# Patient Record
Sex: Female | Born: 1995 | Race: Black or African American | Hispanic: No | Marital: Single | Smoking: Former smoker
Health system: Southern US, Community
[De-identification: ages and names within clinical notes are randomized; demographics above are authoritative.]

## PROBLEM LIST (undated history)

## (undated) ENCOUNTER — Inpatient Hospital Stay (HOSPITAL_COMMUNITY): Payer: Self-pay

## (undated) DIAGNOSIS — N200 Calculus of kidney: Secondary | ICD-10-CM

## (undated) DIAGNOSIS — Z789 Other specified health status: Secondary | ICD-10-CM

## (undated) DIAGNOSIS — N390 Urinary tract infection, site not specified: Secondary | ICD-10-CM

## (undated) HISTORY — PX: NO PAST SURGERIES: SHX2092

---
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
## 2018-01-25 ENCOUNTER — Emergency Department (HOSPITAL_COMMUNITY)
Admission: EM | Admit: 2018-01-25 | Discharge: 2018-01-25 | Disposition: A | Discharge status: Home / Self Care | Payer: Self-pay | Attending: Emergency Medicine | Admitting: Emergency Medicine

## 2018-01-25 ENCOUNTER — Encounter (HOSPITAL_COMMUNITY): Payer: Self-pay

## 2018-01-25 ENCOUNTER — Emergency Department (HOSPITAL_COMMUNITY): Payer: Self-pay

## 2018-01-25 DIAGNOSIS — Y999 Unspecified external cause status: Secondary | ICD-10-CM | POA: Insufficient documentation

## 2018-01-25 DIAGNOSIS — F17210 Nicotine dependence, cigarettes, uncomplicated: Secondary | ICD-10-CM | POA: Insufficient documentation

## 2018-01-25 DIAGNOSIS — Y929 Unspecified place or not applicable: Secondary | ICD-10-CM | POA: Insufficient documentation

## 2018-01-25 DIAGNOSIS — W01190A Fall on same level from slipping, tripping and stumbling with subsequent striking against furniture, initial encounter: Secondary | ICD-10-CM | POA: Insufficient documentation

## 2018-01-25 DIAGNOSIS — Y939 Activity, unspecified: Secondary | ICD-10-CM | POA: Insufficient documentation

## 2018-01-25 DIAGNOSIS — S20212A Contusion of left front wall of thorax, initial encounter: Secondary | ICD-10-CM | POA: Insufficient documentation

## 2018-01-25 MED ORDER — NAPROXEN 375 MG PO TABS: 375.0000 mg | ORAL_TABLET | Freq: Two times a day (BID) | ORAL | 0 refills | Status: DC

## 2018-01-25 MED ORDER — CYCLOBENZAPRINE HCL 10 MG PO TABS
5.0000 mg | ORAL_TABLET | Freq: Once | ORAL | Status: AC
  Administered 2018-01-25: 5 mg via ORAL
  Filled 2018-01-25: qty 1

## 2018-01-25 MED ORDER — CYCLOBENZAPRINE HCL 5 MG PO TABS: 5.0000 mg | ORAL_TABLET | Freq: Every evening | ORAL | 0 refills | Status: DC | PRN

## 2018-01-25 MED ORDER — IBUPROFEN 400 MG PO TABS
400.0000 mg | ORAL_TABLET | Freq: Once | ORAL | Status: AC
  Administered 2018-01-25: 400 mg via ORAL
  Filled 2018-01-25: qty 1

## 2018-01-25 NOTE — ED Triage Notes (Signed)
Pt reports that she fell onto her L side after tripping over something and reports rib pain now, denies SOB, denies hitting head or LOC

## 2018-01-25 NOTE — Discharge Instructions (Signed)
Follow-up with your primary care doctor.  Return here as needed °

## 2018-01-25 NOTE — ED Provider Notes (Signed)
[facility] Provider Note   CSN: [id number] Arrival date & time: 01/25/18  2025     History   Chief Complaint Chief Complaint  Patient presents with  . Rib pain    HPI Alexis Gill is a 22 y.o. female who presents to the ED with rib pain. Patient reports she slipped on a wet floor when mopping and fell on her left side and hit her ribs on the kitchen table. She c/o pain but denies shortness of breath. She denies head injury or LOC.   HPI  History reviewed. No pertinent past medical history.  There are no active problems to display for this patient.   History reviewed. No pertinent surgical history.  OB History    No data available       Home Medications    Prior to Admission medications   Medication Sig Start Date End Date Taking? Authorizing Provider  cyclobenzaprine (FLEXERIL) 5 MG tablet Take 1 tablet (5 mg total) by mouth at bedtime as needed. 01/25/18   Janne NapoleonNeese, Hope M, NP  HYDROcodone-acetaminophen (NORCO/VICODIN) 5-325 MG per tablet Take 1-2 tablets by mouth every 6 (six) hours as needed for moderate pain. 05/28/15   Niel HummerKuhner, Ross, MD  naproxen (NAPROSYN) 375 MG tablet Take 1 tablet (375 mg total) by mouth 2 (two) times daily. 01/25/18   Janne NapoleonNeese, Hope M, NP    Family History No family history on file.  Social History Social History   Tobacco Use  . Smoking status: Current Every Day Smoker  . Smokeless tobacco: Never Used  Substance Use Topics  . Alcohol use: No  . Drug use: No     Allergies   Penicillins and Amoxicillin   Review of Systems Review of Systems  Cardiovascular:       Left rib pain  Musculoskeletal: Positive for arthralgias.  All other systems reviewed and are negative.    Physical Exam Updated Vital Signs BP 111/84   Pulse 94   Temp 98 F (36.7 C) (Oral)   Resp 18   Ht 5\' 1"  (1.549 m)   Wt 47.6 kg (105 lb)   LMP 01/05/2018   SpO2 98%   BMI 19.84 kg/m   Physical Exam    Constitutional: She appears well-developed and well-nourished. No distress.  HENT:  Head: Normocephalic.  Eyes: EOM are normal.  Neck: Neck supple.  Cardiovascular: Normal rate and regular rhythm.  Pulmonary/Chest: Effort normal and breath sounds normal.  Tender with palpation to the left anterior ribs.  Abdominal: There is no tenderness.  Musculoskeletal: Normal range of motion.  Neurological: She is alert.  Skin: Skin is warm and dry.  Psychiatric: She has a normal mood and affect.  Nursing note and vitals reviewed.    ED Treatments / Results  Labs (all labs ordered are listed, but only abnormal results are displayed) Labs Reviewed - No data to display  Radiology Dg Ribs Unilateral W/chest Left  Result Date: 01/25/2018 CLINICAL DATA:  Acute LEFT chest pain following fall and injury today. Initial encounter. EXAM: LEFT RIBS AND CHEST - 3+ VIEW COMPARISON:  None. FINDINGS: Cardiomediastinal silhouette is unremarkable. There is no evidence of focal airspace disease, pulmonary edema, suspicious pulmonary nodule/mass, pleural effusion, or pneumothorax. No acute bony abnormalities are identified. No rib abnormalities are identified. IMPRESSION: Negative. Electronically Signed   By: Harmon PierJeffrey  Hu M.D.   On: 01/25/2018 21:15    Procedures Procedures (including critical care time)  Medications Ordered in ED Medications  cyclobenzaprine (FLEXERIL) tablet 5 mg (not administered)  ibuprofen (ADVIL,MOTRIN) tablet 400 mg (not administered)     Initial Impression / Assessment and Plan / ED Course  I have reviewed the triage vital signs and the nursing notes. 22 y.o. female with left rib pain s/p fall stable for d/c without fracture or pneumothorax noted on x-ray. Will treat for pain and return precautions discussed. Patient agrees with plan.  Final Clinical Impressions(s) / ED Diagnoses   Final diagnoses:  Rib contusion, left, initial encounter    ED Discharge Orders        Ordered     cyclobenzaprine (FLEXERIL) 5 MG tablet  At bedtime PRN     01/25/18 2157    naproxen (NAPROSYN) 375 MG tablet  2 times daily     01/25/18 2157       Kerrie [place], NP 01/25/18 2203    Linwood Dibbles, MD 01/25/18 2212

## 2018-01-25 NOTE — ED Notes (Signed)
ED Provider at bedside. 

## 2018-08-23 ENCOUNTER — Emergency Department (HOSPITAL_COMMUNITY)
Admission: EM | Admit: 2018-08-23 | Discharge: 2018-08-24 | Disposition: A | Discharge status: Home / Self Care | Payer: Self-pay | Attending: Emergency Medicine | Admitting: Emergency Medicine

## 2018-08-23 DIAGNOSIS — R1084 Generalized abdominal pain: Secondary | ICD-10-CM | POA: Insufficient documentation

## 2018-08-23 DIAGNOSIS — Z79899 Other long term (current) drug therapy: Secondary | ICD-10-CM | POA: Insufficient documentation

## 2018-08-23 DIAGNOSIS — R112 Nausea with vomiting, unspecified: Secondary | ICD-10-CM | POA: Insufficient documentation

## 2018-08-23 DIAGNOSIS — F17210 Nicotine dependence, cigarettes, uncomplicated: Secondary | ICD-10-CM | POA: Insufficient documentation

## 2018-08-24 ENCOUNTER — Encounter (HOSPITAL_COMMUNITY): Payer: Self-pay | Admitting: *Deleted

## 2018-08-24 ENCOUNTER — Other Ambulatory Visit: Payer: Self-pay

## 2018-08-24 LAB — COMPREHENSIVE METABOLIC PANEL
ALBUMIN: 3.7 g/dL (ref 3.5–5.0)
ALK PHOS: 34 U/L — AB (ref 38–126)
ALT: 20 U/L (ref 0–44)
AST: 24 U/L (ref 15–41)
Anion gap: 10 (ref 5–15)
BUN: 8 mg/dL (ref 6–20)
CO2: 23 mmol/L (ref 22–32)
CREATININE: 0.71 mg/dL (ref 0.44–1.00)
Calcium: 9.2 mg/dL (ref 8.9–10.3)
Chloride: 108 mmol/L (ref 98–111)
GFR calc non Af Amer: >60 mL/min (ref >60)
GLUCOSE: 96 mg/dL (ref 70–99)
Potassium: 3.5 mmol/L (ref 3.5–5.1)
SODIUM: 141 mmol/L (ref 135–145)
Total Bilirubin: 0.5 mg/dL (ref 0.3–1.2)
Total Protein: 6.4 g/dL — ABNORMAL LOW (ref 6.5–8.1)

## 2018-08-24 LAB — CBC
HCT: 41.1 % (ref 36.0–46.0)
Hemoglobin: 13.7 g/dL (ref 12.0–15.0)
MCH: 32.3 pg (ref 26.0–34.0)
MCHC: 33.3 g/dL (ref 30.0–36.0)
MCV: 96.9 fL (ref 78.0–100.0)
Platelets: 154 10*3/uL (ref 150–400)
RBC: 4.24 MIL/uL (ref 3.87–5.11)
RDW: 12.7 % (ref 11.5–15.5)
WBC: 7.4 10*3/uL (ref 4.0–10.5)

## 2018-08-24 LAB — I-STAT BETA HCG BLOOD, ED (MC, WL, AP ONLY): I-stat hCG, quantitative: <5 m[IU]/mL (ref <5)

## 2018-08-24 LAB — LIPASE, BLOOD: LIPASE: 32 U/L (ref 11–51)

## 2018-08-24 MED ORDER — ONDANSETRON 4 MG PO TBDP: 4.0000 mg | ORAL_TABLET | Freq: Three times a day (TID) | ORAL | 0 refills | Status: DC | PRN

## 2018-08-24 NOTE — Discharge Instructions (Addendum)
1. Medications: You can take [phone] mg of Tylenol every 6 hours as needed for pain. Do not exceed 4000 mg of Tylenol daily.  Can also try acid reflux medicines such as pepcid or zantac. Take Zofran as needed for nausea.  Wait around 20 minutes before eating or drinking after taking this medication. 2. Treatment: rest, drink plenty of fluids, advance diet slowly.  Start with water and broth then advance to bland foods that will not upset your stomach such as crackers, mashed potatoes, and peanut butter. 3. Follow Up: Please followup with your primary doctor in 3 days for discussion of your diagnoses and further evaluation after today's visit; if you do not have a primary care doctor use the resource guide provided to find one; Please return to the ER for persistent vomiting, high fevers or worsening symptoms

## 2018-08-24 NOTE — ED Notes (Addendum)
Pt initially refusing to have vitals rechecked after another pt did.  States they have already rechecked them twice and she doesn't need them checked.  Explained reasoning and pt agreed.  Updated on wait for treatment room.

## 2018-08-24 NOTE — ED Notes (Signed)
Patient cursing and upset stating, it does not make sense she had to wait fr 7 hours. Patient then ask how long with will be before she will she a MD advised would be 15 minutes she states okay.

## 2018-08-24 NOTE — ED Triage Notes (Signed)
abd pain all day  Chills  lmp  Sept 1st

## 2018-08-24 NOTE — ED Provider Notes (Signed)
[facility] EMERGENCY DEPARTMENT Provider Note   CSN: [id number] Arrival date & time: 08/23/18  2358     History   Chief Complaint Chief Complaint  Patient presents with  . Abdominal Pain    HPI Alexis Gill is a 22 y.o. female with no significant past medical history presents for evaluation of acute onset, intermittent generalized abdominal pain since yesterday morning.  She states she woke around 7 AM and noted she felt nauseated.  She has had 2 episodes of nonbloody nonbilious emesis.  Pain in the abdomen is crampy and tight, intermittent and generalized.  No aggravating or alleviating factors noted.  Denies fevers, chills, chest pain, shortness of breath, urinary symptoms, vaginal itching, bleeding, or discharge.  She denies diarrhea or constipation.  Has not tried anything for her symptoms.  She does note multiple people at work have been ill with similar symptoms but have not been staying home.  She also notes that she frequently orders food from [employer] and thinks this may have contributed to her symptoms.  Denies recent travel or treatment with antibiotics.  The history is provided by the patient.    History reviewed. No pertinent past medical history.  There are no active problems to display for this patient.   History reviewed. No pertinent surgical history.   OB History   None      Home Medications    Prior to Admission medications   Medication Sig Start Date End Date Taking? Authorizing Provider  cyclobenzaprine (FLEXERIL) 5 MG tablet Take 1 tablet (5 mg total) by mouth at bedtime as needed. Patient not taking: Reported on 08/24/2018 01/25/18   Janne Napoleon, NP  HYDROcodone-acetaminophen (NORCO/VICODIN) 5-325 MG per tablet Take 1-2 tablets by mouth every 6 (six) hours as needed for moderate pain. Patient not taking: Reported on 08/24/2018 05/28/15   Niel Hummer, MD  naproxen (NAPROSYN) 375 MG tablet Take 1 tablet (375 mg total) by mouth 2  (two) times daily. Patient not taking: Reported on 08/24/2018 01/25/18   Janne Napoleon, NP  ondansetron (ZOFRAN ODT) 4 MG disintegrating tablet Take 1 tablet (4 mg total) by mouth every 8 (eight) hours as needed for nausea or vomiting. 08/24/18   Jeanie Sewer, PA-C    Family History No family history on file.  Social History Social History   Tobacco Use  . Smoking status: Current Every Day Smoker  . Smokeless tobacco: Never Used  Substance Use Topics  . Alcohol use: No  . Drug use: No     Allergies   Penicillins and Amoxicillin   Review of Systems Review of Systems  Constitutional: Negative for chills and fever.  Gastrointestinal: Positive for abdominal pain, nausea and vomiting. Negative for constipation and diarrhea.  Genitourinary: Negative for dysuria, frequency, hematuria, urgency, vaginal bleeding, vaginal discharge and vaginal pain.  All other systems reviewed and are negative.    Physical Exam Updated Vital Signs BP (!) 111/91   Pulse 61   Temp 98.8 F (37.1 C) (Oral)   Resp 16   Ht 5\' 1"  (1.549 m)   Wt 44.5 kg   LMP 08/03/2018   SpO2 100%   BMI 18.52 kg/m   Physical Exam  Constitutional: She appears well-developed and well-nourished. No distress.  Resting comfortably in bed  HENT:  Head: Normocephalic and atraumatic.  Eyes: Conjunctivae are normal. Right eye exhibits no discharge. Left eye exhibits no discharge.  Neck: No JVD present. No tracheal deviation present.  Cardiovascular:  Normal rate, regular rhythm, normal heart sounds and intact distal pulses.  Pulmonary/Chest: Effort normal and breath sounds normal.  Abdominal: Soft. Bowel sounds are normal. She exhibits no distension. There is tenderness in the epigastric area and left upper quadrant. There is no rigidity, no rebound, no guarding, no CVA tenderness, no tenderness at McBurney's point and negative Murphy's sign.  Genitourinary:  Genitourinary Comments: Deferred  Musculoskeletal: She  exhibits no edema.  No midline spine TTP, no paraspinal muscle tenderness, no deformity, crepitus, or step-off noted   Neurological: She is alert.  Skin: Skin is warm and dry. No erythema.  Psychiatric: She has a normal mood and affect. Her behavior is normal.  Nursing note and vitals reviewed.    ED Treatments / Results  Labs (all labs ordered are listed, but only abnormal results are displayed) Labs Reviewed  COMPREHENSIVE METABOLIC PANEL - Abnormal; Notable for the following components:      Result Value   Total Protein 6.4 (*)    Alkaline Phosphatase 34 (*)    All other components within normal limits  LIPASE, BLOOD  CBC  URINALYSIS, ROUTINE W REFLEX MICROSCOPIC  I-STAT BETA HCG BLOOD, ED ([facility], [facility], [facility] ONLY)    EKG None  Radiology No results found.  Procedures Procedures (including critical care time)  Medications Ordered in ED Medications - No data to display   Initial Impression / Assessment and Plan / ED Course  I have reviewed the triage vital signs and the nursing notes.  Pertinent labs & imaging results that were available during my care of the patient were reviewed by me and considered in my medical decision making (see chart for details).     Patient with intermittent generalized abdominal pain since yesterday.  2 episodes of nonbloody nonbilious emesis but otherwise has been tolerating p.o. food and fluids without difficulty.  She is afebrile, vital signs are stable.  She is nontoxic in appearance.  Abdomen is soft with no peritoneal signs on examination.  She has mild left upper quadrant and epigastric tenderness on exam.  Lab work reviewed by me shows no leukocytosis, no metabolic derangements.  LFTs, lipase, and creatinine within normal limits.  UA is not suggestive of UTI or nephrolithiasis.  She is not pregnant.  Doubt obstruction, perforation, appendicitis, colitis, PID, ovarian torsion, ectopic pregnancy, or other acute surgical abdominal pathology.  She  declines any medications in the ED but I did offer her Zofran to take home for nausea.  Symptoms appear consistent with gastroneuritis, likely viral etiology as she has had known sick contacts with similar symptoms.  Discussed advancing diet slowly, p.o. fluid rehydration.  Recommend follow-up with PCP if symptoms persist.  Discussed strict ED return precautions. Pt verbalized understanding of and agreement with plan and is safe for discharge home at this time.   Final Clinical Impressions(s) / ED Diagnoses   Final diagnoses:  Generalized abdominal pain    ED Discharge Orders         Ordered    ondansetron (ZOFRAN ODT) 4 MG disintegrating tablet  Every 8 hours PRN     08/24/18 0622           Jeanie SewerFawze, Anyia Gierke A, PA-C 08/24/18 0737    Palumbo, April, MD 08/27/18 0009

## 2020-04-27 ENCOUNTER — Emergency Department (HOSPITAL_COMMUNITY)
Admission: EM | Admit: 2020-04-27 | Discharge: 2020-04-27 | Disposition: A | Discharge status: Home / Self Care | Payer: Medicaid Other | Attending: Emergency Medicine | Admitting: Emergency Medicine

## 2020-04-27 ENCOUNTER — Other Ambulatory Visit: Payer: Self-pay

## 2020-04-27 DIAGNOSIS — F17210 Nicotine dependence, cigarettes, uncomplicated: Secondary | ICD-10-CM | POA: Insufficient documentation

## 2020-04-27 DIAGNOSIS — Z79899 Other long term (current) drug therapy: Secondary | ICD-10-CM | POA: Insufficient documentation

## 2020-04-27 DIAGNOSIS — Y9389 Activity, other specified: Secondary | ICD-10-CM | POA: Insufficient documentation

## 2020-04-27 DIAGNOSIS — W260XXA Contact with knife, initial encounter: Secondary | ICD-10-CM | POA: Insufficient documentation

## 2020-04-27 DIAGNOSIS — Z23 Encounter for immunization: Secondary | ICD-10-CM | POA: Insufficient documentation

## 2020-04-27 DIAGNOSIS — Y999 Unspecified external cause status: Secondary | ICD-10-CM | POA: Insufficient documentation

## 2020-04-27 DIAGNOSIS — Y92009 Unspecified place in unspecified non-institutional (private) residence as the place of occurrence of the external cause: Secondary | ICD-10-CM | POA: Insufficient documentation

## 2020-04-27 DIAGNOSIS — S61412A Laceration without foreign body of left hand, initial encounter: Secondary | ICD-10-CM | POA: Insufficient documentation

## 2020-04-27 MED ORDER — TETANUS-DIPHTH-ACELL PERTUSSIS 5-2.5-18.5 LF-MCG/0.5 IM SUSP
0.5000 mL | Freq: Once | INTRAMUSCULAR | Status: AC
  Administered 2020-04-27: 0.5 mL via INTRAMUSCULAR
  Filled 2020-04-27: qty 0.5

## 2020-04-27 MED ORDER — ONDANSETRON HCL 4 MG PO TABS
4.0000 mg | ORAL_TABLET | Freq: Once | ORAL | Status: AC
  Administered 2020-04-27: 4 mg via ORAL
  Filled 2020-04-27: qty 1

## 2020-04-27 MED ORDER — HYDROCODONE-ACETAMINOPHEN 5-325 MG PO TABS
1.0000 | ORAL_TABLET | Freq: Once | ORAL | Status: AC
  Administered 2020-04-27: 1 via ORAL
  Filled 2020-04-27: qty 1

## 2020-04-27 MED ORDER — LIDOCAINE HCL (PF) 1 % IJ SOLN
30.0000 mL | Freq: Once | INTRAMUSCULAR | Status: AC
  Administered 2020-04-27: 30 mL
  Filled 2020-04-27: qty 30

## 2020-04-27 NOTE — Discharge Instructions (Addendum)
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
## 2020-11-14 ENCOUNTER — Encounter (HOSPITAL_COMMUNITY): Payer: Self-pay | Admitting: Emergency Medicine

## 2020-11-14 ENCOUNTER — Other Ambulatory Visit: Payer: Self-pay

## 2020-11-14 ENCOUNTER — Emergency Department (HOSPITAL_COMMUNITY)
Admission: EM | Admit: 2020-11-14 | Discharge: 2020-11-14 | Disposition: A | Discharge status: Home / Self Care | Payer: Medicaid Other | Attending: Emergency Medicine | Admitting: Emergency Medicine

## 2020-11-14 ENCOUNTER — Emergency Department (HOSPITAL_COMMUNITY): Payer: Medicaid Other

## 2020-11-14 DIAGNOSIS — W208XXA Other cause of strike by thrown, projected or falling object, initial encounter: Secondary | ICD-10-CM | POA: Insufficient documentation

## 2020-11-14 DIAGNOSIS — S59911A Unspecified injury of right forearm, initial encounter: Secondary | ICD-10-CM | POA: Insufficient documentation

## 2020-11-14 DIAGNOSIS — Z5321 Procedure and treatment not carried out due to patient leaving prior to being seen by health care provider: Secondary | ICD-10-CM | POA: Insufficient documentation

## 2020-11-14 LAB — I-STAT BETA HCG BLOOD, ED (MC, WL, AP ONLY): I-stat hCG, quantitative: <5 m[IU]/mL (ref <5)

## 2020-11-14 NOTE — ED Notes (Signed)
Pt states they are leaving  

## 2020-11-14 NOTE — ED Triage Notes (Signed)
Pt st's a metal rod fell on her right forearm earlier today  Pt c/o pain to that area.  No deformity noted

## 2021-07-02 ENCOUNTER — Inpatient Hospital Stay (HOSPITAL_COMMUNITY)
Admission: AD | Admit: 2021-07-02 | Discharge: 2021-07-02 | Disposition: A | Discharge status: Home / Self Care | Payer: Self-pay | Attending: Obstetrics and Gynecology | Admitting: Obstetrics and Gynecology

## 2021-07-02 ENCOUNTER — Other Ambulatory Visit: Payer: Self-pay

## 2021-07-02 ENCOUNTER — Encounter (HOSPITAL_COMMUNITY): Payer: Self-pay

## 2021-07-02 ENCOUNTER — Inpatient Hospital Stay (HOSPITAL_COMMUNITY): Payer: Self-pay

## 2021-07-02 DIAGNOSIS — Z3A01 Less than 8 weeks gestation of pregnancy: Secondary | ICD-10-CM | POA: Insufficient documentation

## 2021-07-02 DIAGNOSIS — O26891 Other specified pregnancy related conditions, first trimester: Secondary | ICD-10-CM | POA: Insufficient documentation

## 2021-07-02 DIAGNOSIS — R1030 Lower abdominal pain, unspecified: Secondary | ICD-10-CM | POA: Insufficient documentation

## 2021-07-02 DIAGNOSIS — A5901 Trichomonal vulvovaginitis: Secondary | ICD-10-CM

## 2021-07-02 DIAGNOSIS — O98311 Other infections with a predominantly sexual mode of transmission complicating pregnancy, first trimester: Secondary | ICD-10-CM

## 2021-07-02 DIAGNOSIS — O99891 Other specified diseases and conditions complicating pregnancy: Secondary | ICD-10-CM

## 2021-07-02 DIAGNOSIS — O208 Other hemorrhage in early pregnancy: Secondary | ICD-10-CM | POA: Insufficient documentation

## 2021-07-02 DIAGNOSIS — F1290 Cannabis use, unspecified, uncomplicated: Secondary | ICD-10-CM | POA: Insufficient documentation

## 2021-07-02 DIAGNOSIS — B373 Candidiasis of vulva and vagina: Secondary | ICD-10-CM | POA: Insufficient documentation

## 2021-07-02 DIAGNOSIS — O98819 Other maternal infectious and parasitic diseases complicating pregnancy, unspecified trimester: Secondary | ICD-10-CM | POA: Insufficient documentation

## 2021-07-02 DIAGNOSIS — Z880 Allergy status to penicillin: Secondary | ICD-10-CM | POA: Insufficient documentation

## 2021-07-02 DIAGNOSIS — O99321 Drug use complicating pregnancy, first trimester: Secondary | ICD-10-CM | POA: Insufficient documentation

## 2021-07-02 DIAGNOSIS — O4690 Antepartum hemorrhage, unspecified, unspecified trimester: Secondary | ICD-10-CM

## 2021-07-02 HISTORY — DX: Other specified health status: Z78.9

## 2021-07-02 LAB — URINALYSIS, ROUTINE W REFLEX MICROSCOPIC
Bilirubin Urine: NEGATIVE
Glucose, UA: NEGATIVE mg/dL
Hgb urine dipstick: NEGATIVE
Ketones, ur: NEGATIVE mg/dL
Leukocytes,Ua: NEGATIVE
Nitrite: NEGATIVE
Protein, ur: NEGATIVE mg/dL
Specific Gravity, Urine: 1.012 (ref 1.005–1.030)
pH: 6 (ref 5.0–8.0)

## 2021-07-02 LAB — CBC
HCT: 40 % (ref 36.0–46.0)
Hemoglobin: 13.4 g/dL (ref 12.0–15.0)
MCH: 32.1 pg (ref 26.0–34.0)
MCHC: 33.5 g/dL (ref 30.0–36.0)
MCV: 95.7 fL (ref 80.0–100.0)
Platelets: 205 10*3/uL (ref 150–400)
RBC: 4.18 MIL/uL (ref 3.87–5.11)
RDW: 12.5 % (ref 11.5–15.5)
WBC: 8.6 10*3/uL (ref 4.0–10.5)
nRBC: 0 % (ref 0.0–0.2)

## 2021-07-02 LAB — WET PREP, GENITAL
Clue Cells Wet Prep HPF POC: NONE SEEN
Sperm: NONE SEEN

## 2021-07-02 LAB — HCG, QUANTITATIVE, PREGNANCY: hCG, Beta Chain, Quant, S: 2103 m[IU]/mL — ABNORMAL HIGH (ref <5)

## 2021-07-02 LAB — ABO/RH: ABO/RH(D): A POS

## 2021-07-02 LAB — POC URINE PREG, ED: Preg Test, Ur: POSITIVE — AB

## 2021-07-02 MED ORDER — TERCONAZOLE 0.4 % VA CREA: 1.0000 | TOPICAL_CREAM | Freq: Every day | VAGINAL | 0 refills | Status: DC

## 2021-07-02 MED ORDER — METRONIDAZOLE 500 MG PO TABS
2000.0000 mg | ORAL_TABLET | Freq: Once | ORAL | Status: AC
  Administered 2021-07-02: 2000 mg via ORAL
  Filled 2021-07-02: qty 4

## 2021-07-02 NOTE — MAU Note (Signed)
Alexis Gill is a 25 y.o. at [redacted]w[redacted]d here in MAU reporting: today while at work she started cramping and having some vaginal bleeding. Is wearing a pad for the bleeding, states bleeding seems lighter than before. 1 small clot.   LMP: 05/14/2021  Onset of complaint: today  Pain score: 2/10  Vitals:   07/02/21 1226 07/02/21 1418  BP: 104/79 93/64  Pulse: 74 82  Resp: 16 16  Temp: 98.9 F (37.2 C) 98.6 F (37 C)  SpO2: 100% 100%     Lab orders placed from triage: none

## 2021-07-02 NOTE — MAU Provider Note (Addendum)
History     CSN: [id number]  Arrival date and time: 07/02/21 1144   Event Date/Time   First Provider Initiated Contact with Patient 07/02/21 1633      Chief Complaint  Patient presents with   Vaginal Bleeding   Abdominal Pain   Alexis Gill is a 25 y.o. G1P0 at [redacted]w[redacted]d Definite LMP of 05/14/2021 who has not yet established PNC.  She presents today for Vaginal Bleeding and Abdominal Pain. She reports the bleeding started this morning with "normal cramps."  She states these occurred prior to the bleeding.  She states the cramps are located in her sides and in her lower back area.  She states the are intermittent and has no relieving or aggravating factors.  She states she the cramps was "hurting hurting" and when she went to the bathroom she noticed the bleeding.   She states it was "enough to fill a pad." She shows a picture of tissue with light pinkish red blood.  Patient states now the bleeding is with clots and they are the size of pea.  She reports taking a pregnancy tests last week.  She reports she has an [place] app[facility].     OB History     Gravida  1   Para      Term      Preterm      AB      Living         SAB      IAB      Ectopic      Multiple      Live Births              Past Medical History:  Diagnosis Date   Medical history non-contributory     Past Surgical History:  Procedure Laterality Date   NO PAST SURGERIES      No family history on file.  Social History   Tobacco Use   Smoking status: Every Day   Smokeless tobacco: Never  Substance Use Topics   Alcohol use: No   Drug use: Yes    Types: Marijuana    Allergies:  Allergies  Allergen Reactions   Penicillins Anaphylaxis   Amoxicillin Swelling    Medications Prior to Admission  Medication Sig Dispense Refill Last Dose   Prenatal Vit-Fe Fumarate-FA (PRENATAL MULTIVITAMIN) TABS tablet Take 1 tablet by mouth daily at 12 noon.   07/02/2021    cyclobenzaprine (FLEXERIL) 5 MG tablet Take 1 tablet (5 mg total) by mouth at bedtime as needed. (Patient not taking: Reported on 08/24/2018) 5 tablet 0    HYDROcodone-acetaminophen (NORCO/VICODIN) 5-325 MG per tablet Take 1-2 tablets by mouth every 6 (six) hours as needed for moderate pain. (Patient not taking: Reported on 08/24/2018) 30 tablet 0    naproxen (NAPROSYN) 375 MG tablet Take 1 tablet (375 mg total) by mouth 2 (two) times daily. (Patient not taking: Reported on 08/24/2018) 20 tablet 0    ondansetron (ZOFRAN ODT) 4 MG disintegrating tablet Take 1 tablet (4 mg total) by mouth every 8 (eight) hours as needed for nausea or vomiting. 6 tablet 0     Review of Systems  Constitutional:  Negative for chills and fever.  Gastrointestinal:  Positive for abdominal pain. Negative for nausea and vomiting. Anal bleeding: Cramping. Genitourinary:  Positive for vaginal bleeding and vaginal discharge (Yesterday cloudy, no odor). Negative for difficulty urinating and dysuria.  Neurological:  Negative for dizziness, light-headedness and  headaches.  Physical Exam   Blood pressure 100/70, pulse 86, temperature 98.6 F (37 C), temperature source Oral, resp. rate 16, height 5\' 1"  (1.549 m), weight 38.6 kg, last menstrual period 05/14/2021, SpO2 100 %.  Physical Exam Vitals reviewed. Exam conducted with a chaperone present.  Constitutional:      Appearance: She is well-developed.  HENT:     Head: Normocephalic and atraumatic.  Eyes:     Conjunctiva/sclera: Conjunctivae normal.  Cardiovascular:     Rate and Rhythm: Normal rate and regular rhythm.     Heart sounds: Normal heart sounds.  Pulmonary:     Effort: Pulmonary effort is normal. No respiratory distress.  Abdominal:     Palpations: Abdomen is soft.  Genitourinary:    Vagina: Bleeding present.     Cervix: No discharge.     Uterus: Enlarged.      Comments: Speculum Exam: -Normal External Genitalia: Non tender, no apparent discharge at  introitus.  -Vaginal Vault: Pink mucosa with good rugae. Small amt bloody discharge in vault -wet prep collected -Cervix:Pink, no lesions, cysts, or polyps.  Appears closed. No active bleeding from os-GC/CT collected -Bimanual Exam:  Nontender. No CMT. Uterus with some enlargement.    Musculoskeletal:        General: Normal range of motion.     Cervical back: Normal range of motion.  Skin:    General: Skin is warm and dry.  Neurological:     Mental Status: She is alert and oriented to person, place, and time.  Psychiatric:        Mood and Affect: Mood normal.        Behavior: Behavior normal.        Thought Content: Thought content normal.  05/16/2021 OB LESS THAN 14 WEEKS WITH OB TRANSVAGINAL  Result Date: 07/02/2021 CLINICAL DATA:  Vaginal bleeding, beta HCG 2,103 EXAM: OBSTETRIC <14 WK 04-26-1982 AND TRANSVAGINAL OB US TECHNIQUE: Both transabdominal and transvaginal ultrasound examinations were performed for complete evaluation of the gestation as well as the maternal uterus, adnexal regions, and pelvic cul-de-sac. Transvaginal technique was performed to assess early pregnancy. COMPARISON:  None. FINDINGS: Intrauterine gestational sac: Single Yolk sac:  Visualized. Embryo:  Not Visualized. Cardiac Activity: Not Visualized. MSD: 6.3 mm   5 w   2 d Subchorionic hemorrhage: Trace subchorionic hemorrhage along the ventral aspect of the gestational sac. Maternal uterus/adnexae: Right ovary measures 2.3 x 1.6 x 2.1 cm and the left ovary measures 1.8 x 1.0 x 1.8 cm. Probable corpus luteum cyst within the right ovary. Trace pelvic free fluid likely physiologic. IMPRESSION: 1. Probable early intrauterine gestational sac and yolk sac, but no fetal pole or cardiac activity yet visualized. Recommend follow-up quantitative B-HCG levels and follow-up US in 14 days to assess viability. This recommendation follows SRU consensus guidelines: Diagnostic Criteria for Nonviable Pregnancy Early in the First Trimester. [place] Med  20132014. 2. Trace subchorionic hemorrhage along the ventral aspect of the gestational sac. Electronically Signed   By: ; [id number] M.D.   On: 07/02/2021 18:28     MAU Course  Procedures Results for orders placed or performed during the hospital encounter of 07/02/21 (from the past 24 hour(s))  Urinalysis, Routine w reflex microscopic Urine, Clean Catch     Status: None   Collection Time: 07/02/21 12:38 PM  Result Value Ref Range   Color, Urine YELLOW YELLOW   APPearance CLEAR CLEAR   Specific Gravity, Urine 1.012 1.005 - 1.030   pH  6.0 5.0 - 8.0   Glucose, UA NEGATIVE NEGATIVE mg/dL   Hgb urine dipstick NEGATIVE NEGATIVE   Bilirubin Urine NEGATIVE NEGATIVE   Ketones, ur NEGATIVE NEGATIVE mg/dL   Protein, ur NEGATIVE NEGATIVE mg/dL   Nitrite NEGATIVE NEGATIVE   Leukocytes,Ua NEGATIVE NEGATIVE  POC Urine Pregnancy, ED (not at [facility])     Status: Abnormal   Collection Time: 07/02/21 12:56 PM  Result Value Ref Range   Preg Test, Ur POSITIVE (A) NEGATIVE  CBC     Status: None   Collection Time: 07/02/21  2:08 PM  Result Value Ref Range   WBC 8.6 4.0 - 10.5 K/uL   RBC 4.18 3.87 - 5.11 MIL/uL   Hemoglobin 13.4 12.0 - 15.0 g/dL   HCT 99.2 42.6 - 83.4 %   MCV 95.7 80.0 - 100.0 fL   MCH 32.1 26.0 - 34.0 pg   MCHC 33.5 30.0 - 36.0 g/dL   RDW 19.6 22.2 - 97.9 %   Platelets 205 150 - 400 K/uL   nRBC 0.0 0.0 - 0.2 %  hCG, quantitative, pregnancy     Status: Abnormal   Collection Time: 07/02/21  2:08 PM  Result Value Ref Range   hCG, Beta Chain, Quant, S 2,103 (H) <5 mIU/mL  ABO/Rh     Status: None   Collection Time: 07/02/21  2:13 PM  Result Value Ref Range   ABO/RH(D) A POS    No rh immune globuloin      NOT A RH IMMUNE GLOBULIN CANDIDATE, PT RH POSITIVE Performed at [facility], [address], [place], [place] [address]   Wet prep, genital     Status: Abnormal   Collection Time: 07/02/21  4:48 PM  Result Value Ref Range   Yeast Wet Prep HPF POC PRESENT (A)  NONE SEEN   Trich, Wet Prep PRESENT (A) NONE SEEN   Clue Cells Wet Prep HPF POC NONE SEEN NONE SEEN   WBC, Wet Prep HPF POC MODERATE (A) NONE SEEN   Sperm NONE SEEN     MDM Pelvic Exam; Wet Prep and GC/CT Labs: UA, UPT, CBC, hCG, ABO Ultrasound Antifungal EPT  Assessment and Plan  25 year old G1P0 at 7 weeks Vaginal Bleeding A Positive  -Labs ordered in triage. -Returned as above. -Reviewed labs and POC with patient. -Exam performed and findings discussed.  -Cultures collected and pending.  -Patient offered and declines pain medication -Will send for [place] and await results.   Cherre Robins 07/02/2021, 4:33 PM   Reassessment (6:17 PM) Trichomoniasis  IUGS/YS w/o FP  -Results return positive for trich. -Will treat. -Provider to bedside to discuss results. -Informed of diagnosis and treatment. -Patient appropriately upset. -Reviewed EPT and patient agreeable. -Script for Delton Spikes 08/01/1995 given for Flagyl. -Informed of yeast infection. Will treat with Terazol 7. -Script printed as no pharmacy on file.  -Reviewed US findings and need for follow up.  -Will plan for f/u US in 10-14 days.  Order placed. -Encouraged to call or return to MAU if symptoms worsen or with the onset of new symptoms. -Discharged to home in stable condition.  Cherre Robins MSN, CNM Advanced Practice Provider, [facility] [employer]

## 2021-07-02 NOTE — ED Provider Notes (Signed)
Emergency Medicine Provider Triage Evaluation Note  Alexis Gill , a 25 y.o. female  was evaluated in triage.  Pt complains of vaginal bleeding in context of early pregnancy.  Pulse pregnancy test at home last week, LMP 05/14/2021.  Lower abdominal cramping and vaginal bleeding started this morning..  Review of Systems  Positive: Vaginal bleeding, abdominal cramping Negative: Fevers, chills, nausea, vomiting  Physical Exam  BP 104/79 (BP Location: Right Arm)   Pulse 74   Temp 98.9 F (37.2 C) (Oral)   Resp 16   Ht 5\' 1"  (1.549 m)   Wt 39 kg   LMP 05/14/2021   SpO2 100%   BMI 16.25 kg/m  Gen:   Awake, no distress   Resp:  Normal effort  MSK:   Moves extremities without difficulty  Other:  RRR no M/R/G.  Abdomen soft nondistended, nontender.  Medical Decision Making  Medically screening exam initiated at 12:37 PM.  Appropriate orders placed.  Alexis Gill was informed that the remainder of the evaluation will be completed by another provider, this initial triage assessment does not replace that evaluation, and the importance of remaining in the ED until their evaluation is complete.  Obtaining urine pregnancy test here in the emergency department.  If positive will transfer to the MAU.   Per lab, patient's pregnancy test is positive.  Discussed case with MAU APP Ernst Bowler who is agreeable to receiving the patient and her department.  This chart was dictated using voice recognition software, Dragon. Despite the best efforts of this provider to proofread and correct errors, errors may still occur which can change documentation meaning.    Shanda Bumps 07/02/21 1304    09/01/21, MD 07/02/21 2009

## 2021-07-02 NOTE — ED Triage Notes (Signed)
Pt reports vaginal bleeding since this morning when she went to the bathroom. Pt is pregnant but unsure how far along. LMP 6/13. Denies abd pain or n/v

## 2021-07-03 ENCOUNTER — Other Ambulatory Visit: Payer: Self-pay | Admitting: Advanced Practice Midwife

## 2021-07-03 DIAGNOSIS — A749 Chlamydial infection, unspecified: Secondary | ICD-10-CM

## 2021-07-03 LAB — GC/CHLAMYDIA PROBE AMP (~~LOC~~) NOT AT ARMC
Chlamydia: POSITIVE — AB
Comment: NEGATIVE
Comment: NORMAL
Neisseria Gonorrhea: NEGATIVE

## 2021-07-03 MED ORDER — AZITHROMYCIN 500 MG PO TABS: 1000.0000 mg | ORAL_TABLET | Freq: Once | ORAL | 0 refills | Status: AC

## 2021-07-04 ENCOUNTER — Telehealth: Payer: Self-pay | Admitting: Medical

## 2021-07-04 DIAGNOSIS — A749 Chlamydial infection, unspecified: Secondary | ICD-10-CM

## 2021-07-04 MED ORDER — AZITHROMYCIN 250 MG PO TABS: 1000.0000 mg | ORAL_TABLET | Freq: Once | ORAL | 0 refills | Status: AC

## 2021-07-04 NOTE — Telephone Encounter (Signed)
-----   Message from Kathe Becton, RN sent at 07/03/2021  4:35 PM EDT ----- This patient tested positive for Chlamydia   She is allergic to Penicillin and Amoxicillin. I have informed the patient of her results and confirmed her pharmacy is correct in her chart. Please send Rx.  Pt. Requested an RX to be called into the pharmacy for partner.  Thank you,   Kathe Becton, RN   Results faxed to [facility].

## 2021-07-04 NOTE — Telephone Encounter (Signed)
Alexis Gill tested positive for  Chlamydia. Patient was called by RN and allergies and pharmacy confirmed. Rx sent to pharmacy of choice.   Marny Lowenstein, PA-C 07/04/2021 10:10 AM

## 2021-07-09 ENCOUNTER — Telehealth: Payer: Self-pay | Admitting: Advanced Practice Midwife

## 2021-07-10 NOTE — Telephone Encounter (Signed)
Pt received Rx for azithromycin to treat positive chlamydia. Pt requested partner therapy so I called pt today to offer written Rx for partner if desired.  No answer, and I was unable to leave a message.

## 2021-08-17 ENCOUNTER — Other Ambulatory Visit: Payer: Self-pay

## 2021-08-17 ENCOUNTER — Encounter (HOSPITAL_COMMUNITY): Payer: Self-pay | Admitting: Emergency Medicine

## 2021-08-17 ENCOUNTER — Ambulatory Visit (INDEPENDENT_AMBULATORY_CARE_PROVIDER_SITE_OTHER): Payer: Self-pay

## 2021-08-17 ENCOUNTER — Ambulatory Visit (HOSPITAL_COMMUNITY)
Admission: EM | Admit: 2021-08-17 | Discharge: 2021-08-17 | Disposition: A | Discharge status: Home / Self Care | Payer: Self-pay | Attending: Emergency Medicine | Admitting: Emergency Medicine

## 2021-08-17 DIAGNOSIS — R1011 Right upper quadrant pain: Secondary | ICD-10-CM

## 2021-08-17 LAB — CBC WITH DIFFERENTIAL/PLATELET
Abs Immature Granulocytes: 0.04 10*3/uL (ref 0.00–0.07)
Basophils Absolute: 0 10*3/uL (ref 0.0–0.1)
Basophils Relative: 0 %
Eosinophils Absolute: 0.1 10*3/uL (ref 0.0–0.5)
Eosinophils Relative: 1 %
HCT: 39.3 % (ref 36.0–46.0)
Hemoglobin: 13.3 g/dL (ref 12.0–15.0)
Immature Granulocytes: 0 %
Lymphocytes Relative: 23 %
Lymphs Abs: 2.3 10*3/uL (ref 0.7–4.0)
MCH: 32.6 pg (ref 26.0–34.0)
MCHC: 33.8 g/dL (ref 30.0–36.0)
MCV: 96.3 fL (ref 80.0–100.0)
Monocytes Absolute: 0.7 10*3/uL (ref 0.1–1.0)
Monocytes Relative: 8 %
Neutro Abs: 6.5 10*3/uL (ref 1.7–7.7)
Neutrophils Relative %: 68 %
Platelets: 214 10*3/uL (ref 150–400)
RBC: 4.08 MIL/uL (ref 3.87–5.11)
RDW: 13 % (ref 11.5–15.5)
WBC: 9.6 10*3/uL (ref 4.0–10.5)
nRBC: 0 % (ref 0.0–0.2)

## 2021-08-17 LAB — COMPREHENSIVE METABOLIC PANEL
ALT: 14 U/L (ref 0–44)
AST: 17 U/L (ref 15–41)
Albumin: 3.9 g/dL (ref 3.5–5.0)
Alkaline Phosphatase: 41 U/L (ref 38–126)
Anion gap: 9 (ref 5–15)
BUN: 9 mg/dL (ref 6–20)
CO2: 25 mmol/L (ref 22–32)
Calcium: 9.8 mg/dL (ref 8.9–10.3)
Chloride: 106 mmol/L (ref 98–111)
Creatinine, Ser: 0.74 mg/dL (ref 0.44–1.00)
GFR, Estimated: >60 mL/min (ref >60)
Glucose, Bld: 82 mg/dL (ref 70–99)
Potassium: 4.2 mmol/L (ref 3.5–5.1)
Sodium: 140 mmol/L (ref 135–145)
Total Bilirubin: 0.7 mg/dL (ref 0.3–1.2)
Total Protein: 6.9 g/dL (ref 6.5–8.1)

## 2021-08-17 LAB — POCT URINALYSIS DIPSTICK, ED / UC
Bilirubin Urine: NEGATIVE
Glucose, UA: NEGATIVE mg/dL
Hgb urine dipstick: NEGATIVE
Ketones, ur: 15 mg/dL — AB
Leukocytes,Ua: NEGATIVE
Nitrite: NEGATIVE
Protein, ur: NEGATIVE mg/dL
Specific Gravity, Urine: 1.025 (ref 1.005–1.030)
Urobilinogen, UA: 0.2 mg/dL (ref 0.0–1.0)
pH: 6 (ref 5.0–8.0)

## 2021-08-17 LAB — LIPASE, BLOOD: Lipase: 28 U/L (ref 11–51)

## 2021-08-17 NOTE — ED Triage Notes (Signed)
Pt presents with right side pain that radiates up into neck. States does a lot of heavy lifting at work.

## 2021-08-17 NOTE — ED Provider Notes (Signed)
[facility]    CSN: [id number] Arrival date & time: 08/17/21  5427      History   Chief Complaint Chief Complaint  Patient presents with   Abdominal Pain    Right    HPI Alexis Gill is a 25 y.o. female.   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  States that 3 days ago she began to notice that she has a pain in her right side which was initially tolerable but over the past 3 days has become increasingly worse.  States she is now unable to sleep through the night secondary to pain with movement in bed, states that when she takes deep breath the pain is almost intolerable.  Patient denies a family history of gastritis or cholelithiasis, patient denies burning with urination, pelvic pressure, unusual urine odor, increased pain with eating greasy foods, blood in urine, cough, recent upper respiratory illness fever, aches, chills, nausea, vomiting, diarrhea, constipation.  The history is provided by the patient.  Abdominal Pain  Past Medical History:  Diagnosis Date   Medical history non-contributory     There are no problems to display for this patient.   Past Surgical History:  Procedure Laterality Date   NO PAST SURGERIES      OB History     Gravida  1   Para      Term      Preterm      AB      Living         SAB      IAB      Ectopic      Multiple      Live Births               Home Medications    Prior to Admission medications   Medication Sig Start Date End Date Taking? Authorizing Provider  ondansetron (ZOFRAN ODT) 4 MG disintegrating tablet Take 1 tablet (4 mg total) by mouth every 8 (eight) hours as needed for nausea or vomiting. 08/24/18   Michela Pitcher A, PA-C  Prenatal Vit-Fe Fumarate-FA (PRENATAL MULTIVITAMIN) TABS tablet Take 1 tablet by mouth daily at 12 noon.    [provider]  terconazole (TERAZOL 7) 0.4 % vaginal  cream Place 1 applicator vaginally at bedtime. 07/02/21   Gerrit Heck, CNM    Family History History reviewed. No pertinent family history.  Social History Social History   Tobacco Use   Smoking status: Every Day   Smokeless tobacco: Never  Substance Use Topics   Alcohol use: No   Drug use: Yes    Types: Marijuana     Allergies   Penicillins and Amoxicillin   Review of Systems Review of Systems Per HPI  Physical Exam Triage Vital Signs ED Triage Vitals  Enc Vitals Group     BP 08/17/21 0945 104/70     Pulse Rate 08/17/21 0945 70     Resp 08/17/21 0945 16     Temp 08/17/21 0945 98.1 F (36.7 C)     Temp Source 08/17/21 0945 Oral     SpO2 08/17/21 0945 98 %     Weight --      Height --      Head Circumference --      Peak Flow --      Pain Score 08/17/21 0943 8     Pain Loc --  Pain Edu? --      Excl. in GC? --    No data found.  Updated Vital Signs BP 104/70 (BP Location: Left Arm)   Pulse 70   Temp 98.1 F (36.7 C) (Oral)   Resp 16   LMP 08/01/2020 Comment: States had miscarriage.  SpO2 98%   Breastfeeding Unknown   Visual Acuity Right Eye Distance:   Left Eye Distance:   Bilateral Distance:    Right Eye Near:   Left Eye Near:    Bilateral Near:     Physical Exam Constitutional:      Appearance: She is well-developed.  HENT:     Head: Normocephalic and atraumatic.  Cardiovascular:     Rate and Rhythm: Normal rate and regular rhythm.     Heart sounds: Normal heart sounds.  Pulmonary:     Effort: Pulmonary effort is normal.     Breath sounds: Normal breath sounds.  Abdominal:     General: Abdomen is flat. Bowel sounds are increased. There is no distension or abdominal bruit. There are no signs of injury.     Palpations: Abdomen is soft. There is hepatomegaly. There is no shifting dullness, fluid wave, splenomegaly, mass or pulsatile mass.     Tenderness: There is abdominal tenderness. There is right CVA tenderness. There is no left  CVA tenderness, guarding or rebound. Positive signs include Murphy's sign. Negative signs include Rovsing's sign, McBurney's sign, psoas sign and obturator sign.     Hernia: No hernia is present.  Skin:    General: Skin is warm and dry.  Neurological:     General: No focal deficit present.     Mental Status: She is alert and oriented to person, place, and time.  Psychiatric:        Mood and Affect: Mood is anxious.        Behavior: Behavior normal.     UC Treatments / Results  Labs (all labs ordered are listed, but only abnormal results are displayed) Labs Reviewed  POCT URINALYSIS DIPSTICK, ED / UC - Abnormal; Notable for the following components:      Result Value   Ketones, ur 15 (*)    All other components within normal limits  CBC WITH DIFFERENTIAL/PLATELET  COMPREHENSIVE METABOLIC PANEL  LIPASE, BLOOD    EKG   Radiology DG Abd Acute W/Chest  Result Date: 08/17/2021 CLINICAL DATA:  Right upper quadrant pain EXAM: DG ABDOMEN ACUTE WITH 1 VIEW CHEST COMPARISON:  None. FINDINGS: There is no evidence of dilated bowel loops or free intraperitoneal air. No radiopaque calculi or other significant radiographic abnormality is seen. Heart size and mediastinal contours are within normal limits. Both lungs are clear. IMPRESSION: Negative abdominal radiographs.  No acute cardiopulmonary disease. Electronically Signed   By: Allegra Lai M.D.   On: 08/17/2021 10:53    Procedures Procedures (including critical care time)  Medications Ordered in UC Medications - No data to display  Initial Impression / Assessment and Plan / UC Course  I have reviewed the triage vital signs and the nursing notes.  Pertinent labs & imaging results that were available during my care of the patient were reviewed by me and considered in my medical decision making (see chart for details).     KUB today is unremarkable for any bowel distention or signs of obstruction.  Urinalysis was only remarkable  for a slightly elevated ketone, most likely due to the fact that patient is not eating or drinking as she normally  does.  CBC, metabolic panel and lipase levels have been ordered, results will be provided to patient once available.  Based on my physical exam findings I am concerned that patient may have cholecystitis.  Patient has been advised that the labs that we obtained today may rule this out or rule this in.  Patient is also advised that definitive diagnosis will require a CT of her abdomen which cannot be done here.  Patient has been advised to go to the emergency room for significant worsening of pain.  Patient also advised that unable to treat her pain at this time due to risk of abdominal infection due to ruptured gallbladder should she not be able to evaluate her pain moment to moment.  Patient verbalized understanding and agreement with plan. Final Clinical Impressions(s) / UC Diagnoses   Final diagnoses:  Abdominal pain, right upper quadrant     Discharge Instructions      Your urinalysis today was remarkable for ketones meaning that you were at this time not drinking enough fluids.  The imaging done of your abdomen was unremarkable.  As we discussed, based on the physical exam findings it is possible that you may have inflammation of her gallbladder.  Because of the risk of infection with an inflamed gallbladder should not rupture, you need to be at all times aware of your pain so that you will know whether or not you need to be seen emergently.  For this reason, I am not able to provide you with any pain medications.  Additionally, should your pain acutely worsen or should it become intolerable, you should report to the emergency room immediately for evaluation.     ED Prescriptions   None    PDMP not reviewed this encounter.   Theadora Rama Scales, PA-C 08/17/21 1115

## 2021-08-17 NOTE — Discharge Instructions (Addendum)
Your urinalysis today was remarkable for ketones meaning that you were at this time not drinking enough fluids.  The imaging done of your abdomen was unremarkable.  As we discussed, based on the physical exam findings it is possible that you may have inflammation of her gallbladder.  Because of the risk of infection with an inflamed gallbladder should not rupture, you need to be at all times aware of your pain so that you will know whether or not you need to be seen emergently.  For this reason, I am not able to provide you with any pain medications.  Additionally, should your pain acutely worsen or should it become intolerable, you should report to the emergency room immediately for evaluation.

## 2021-08-21 ENCOUNTER — Emergency Department (HOSPITAL_COMMUNITY): Payer: Medicaid Other

## 2021-08-21 ENCOUNTER — Other Ambulatory Visit: Payer: Self-pay

## 2021-08-21 ENCOUNTER — Emergency Department (HOSPITAL_COMMUNITY)
Admission: EM | Admit: 2021-08-21 | Discharge: 2021-08-21 | Disposition: A | Discharge status: Home / Self Care | Payer: Medicaid Other | Attending: Emergency Medicine | Admitting: Emergency Medicine

## 2021-08-21 DIAGNOSIS — R0789 Other chest pain: Secondary | ICD-10-CM | POA: Insufficient documentation

## 2021-08-21 DIAGNOSIS — R0602 Shortness of breath: Secondary | ICD-10-CM | POA: Insufficient documentation

## 2021-08-21 DIAGNOSIS — F17200 Nicotine dependence, unspecified, uncomplicated: Secondary | ICD-10-CM | POA: Insufficient documentation

## 2021-08-21 DIAGNOSIS — R1011 Right upper quadrant pain: Secondary | ICD-10-CM | POA: Insufficient documentation

## 2021-08-21 LAB — I-STAT BETA HCG BLOOD, ED (MC, WL, AP ONLY): I-stat hCG, quantitative: <5 m[IU]/mL (ref <5)

## 2021-08-21 LAB — D-DIMER, QUANTITATIVE: D-Dimer, Quant: 1.96 ug/mL-FEU — ABNORMAL HIGH (ref 0.00–0.50)

## 2021-08-21 LAB — CBC WITH DIFFERENTIAL/PLATELET
Abs Immature Granulocytes: 0 10*3/uL (ref 0.00–0.07)
Basophils Absolute: 0.1 10*3/uL (ref 0.0–0.1)
Basophils Relative: 1 %
Eosinophils Absolute: 0.1 10*3/uL (ref 0.0–0.5)
Eosinophils Relative: 1 %
HCT: 40.2 % (ref 36.0–46.0)
Hemoglobin: 13.8 g/dL (ref 12.0–15.0)
Lymphocytes Relative: 31 %
Lymphs Abs: 2.4 10*3/uL (ref 0.7–4.0)
MCH: 33.3 pg (ref 26.0–34.0)
MCHC: 34.3 g/dL (ref 30.0–36.0)
MCV: 97.1 fL (ref 80.0–100.0)
Monocytes Absolute: 0.3 10*3/uL (ref 0.1–1.0)
Monocytes Relative: 4 %
Neutro Abs: 4.9 10*3/uL (ref 1.7–7.7)
Neutrophils Relative %: 63 %
Platelets: 246 10*3/uL (ref 150–400)
RBC: 4.14 MIL/uL (ref 3.87–5.11)
RDW: 13.1 % (ref 11.5–15.5)
Smear Review: NORMAL
WBC Morphology: ABNORMAL
WBC: 7.7 10*3/uL (ref 4.0–10.5)
nRBC: 0 % (ref 0.0–0.2)

## 2021-08-21 LAB — COMPREHENSIVE METABOLIC PANEL
ALT: 16 U/L (ref 0–44)
AST: 23 U/L (ref 15–41)
Albumin: 3.7 g/dL (ref 3.5–5.0)
Alkaline Phosphatase: 37 U/L — ABNORMAL LOW (ref 38–126)
Anion gap: 8 (ref 5–15)
BUN: 9 mg/dL (ref 6–20)
CO2: 27 mmol/L (ref 22–32)
Calcium: 10.1 mg/dL (ref 8.9–10.3)
Chloride: 103 mmol/L (ref 98–111)
Creatinine, Ser: 0.69 mg/dL (ref 0.44–1.00)
GFR, Estimated: >60 mL/min (ref >60)
Glucose, Bld: 76 mg/dL (ref 70–99)
Potassium: 4 mmol/L (ref 3.5–5.1)
Sodium: 138 mmol/L (ref 135–145)
Total Bilirubin: 0.3 mg/dL (ref 0.3–1.2)
Total Protein: 7.1 g/dL (ref 6.5–8.1)

## 2021-08-21 LAB — URINALYSIS, ROUTINE W REFLEX MICROSCOPIC
Bacteria, UA: NONE SEEN
Bilirubin Urine: NEGATIVE
Glucose, UA: NEGATIVE mg/dL
Hgb urine dipstick: NEGATIVE
Ketones, ur: NEGATIVE mg/dL
Leukocytes,Ua: NEGATIVE
Nitrite: NEGATIVE
Protein, ur: NEGATIVE mg/dL
Specific Gravity, Urine: 1.015 (ref 1.005–1.030)
pH: 6.5 (ref 5.0–8.0)

## 2021-08-21 LAB — TROPONIN I (HIGH SENSITIVITY): Troponin I (High Sensitivity): <2 ng/L (ref <18)

## 2021-08-21 LAB — LIPASE, BLOOD: Lipase: 40 U/L (ref 11–51)

## 2021-08-21 MED ORDER — IOHEXOL 350 MG/ML SOLN
80.0000 mL | Freq: Once | INTRAVENOUS | Status: AC | PRN
  Administered 2021-08-21: 80 mL via INTRAVENOUS

## 2021-08-21 NOTE — Discharge Instructions (Addendum)
Your workup was overall reassuring in the ED today without any obvious findings in your labwork or CT scans.   I would recommend Ibuprofen and Tylenol as needed for pain and following up with your PCP for further eval. If you do not have a PCP you can follow up with [facility] for primary care needs.   Return to the ED for any new/worsening symptoms

## 2021-08-21 NOTE — ED Provider Notes (Signed)
Emergency Medicine Provider Triage Evaluation Note  Alexis Gill , a 25 y.o. female  was evaluated in triage.  Pt complains of abd pain and gallbladder inflammation. Seen by UC seen here. With pain under ribcage with exertion. No hx of dvt, ocp use, long travel, hemoptysis  Review of Systems  Positive: Constipation, abd pain Negative: NV  Physical Exam  BP 96/62 (BP Location: Right Arm)   Pulse 90   Temp 98.3 F (36.8 C) (Oral)   Resp 17   LMP 05/14/2021   SpO2 100%  Gen:   Awake, no distress   Resp:  Normal effort  MSK:   Moves extremities without difficulty  Other:  +murphys  Medical Decision Making  Medically screening exam initiated at 11:19 AM.  Appropriate orders placed.  Alexis Gill was informed that the remainder of the evaluation will be completed by another provider, this initial triage assessment does not replace that evaluation, and the importance of remaining in the ED until their evaluation is complete.     Woodroe Chen 08/21/21 1122    Terald Sleeper, MD 08/21/21 1330

## 2021-08-21 NOTE — ED Triage Notes (Signed)
Pt arrived c/o abdominal pain and concerned about gallstones.  Pt was seen at [facility] and they told her to come to ED for a CT scan for worsening pain

## 2021-08-21 NOTE — ED Notes (Signed)
Pt just got back from waiting room. Changing into gown now. I introduced myself to pt. Pt AxO x4 with a GCS of 15. Ambulatory back and steady on feet. Given new warm blanket.

## 2021-08-21 NOTE — ED Notes (Signed)
Pt refused istat preg blood work.

## 2021-08-21 NOTE — ED Notes (Signed)
Pt here with some upper abdominal/chest pain that started on Friday. Was seen at the [facility] and they told her it might be her gallbladder because of the type of pain she's having and the fact that it's not getting better and told her to come here. Pain 7/10.

## 2021-08-21 NOTE — ED Notes (Signed)
Pt ambulatory at d/c. VSS. GCS 15. Pt steady on feet. 

## 2021-08-21 NOTE — ED Provider Notes (Signed)
[facility] EMERGENCY DEPARTMENT Provider Note   CSN: [id number] Arrival date & time: 08/21/21  1009     History Chief Complaint  Patient presents with   Abdominal Pain    Alexis Gill is a 25 y.o. female who presents to the ED today with complaint of gradual onset, constant, waxing and waning, RUQ/right chest wall pain for the past 2-3 weeks.  She reports about 2 to 3 weeks ago she started having intermittent sharp shooting pains in her chest diffusely.  She did not think much of it as she states she has been doing heavy lifting at work and thought maybe she pulled a muscle.  She states she went to urgent care a couple of days ago and was evaluated and told that her gallbladder was inflamed.  She was advised to come to the ED for further evaluation and was told she needs a CT scan for further assessment of her gallbladder.  Patient denies any nausea, vomiting, diarrhea.  She states she has felt very gassy the past couple of days however has not had a bowel movement in 2 to 3 days.  She is passing gas.  No previous abdominal surgeries.  She states that her pain is worse with exertion as well as deep inspiration.  She does mention that about couple of weeks ago after her chest pain had started she ate a steak for dinner and in the morning she woke up with worsening pain however the pain started 8 to 10 hours after eating the steak.  She states she has been eating ham and cheese 12 inch stubs from [employer] without any difficulty or worsening pain.  Per chart review patient was seen in the MAU in early August for pregnancy however states that she unfortunately had a miscarriage a couple of days later.  She is not on birth control.  She denies any history of DVT or PE.  She denies any recent prolonged travel or immobilization.  She denies any active malignancy.  No hemoptysis.   The history is provided by the patient and medical records.      Past Medical History:  Diagnosis Date    Medical history non-contributory     There are no problems to display for this patient.   Past Surgical History:  Procedure Laterality Date   NO PAST SURGERIES       OB History     Gravida  1   Para      Term      Preterm      AB      Living         SAB      IAB      Ectopic      Multiple      Live Births              No family history on file.  Social History   Tobacco Use   Smoking status: Every Day   Smokeless tobacco: Never  Substance Use Topics   Alcohol use: No   Drug use: Yes    Types: Marijuana    Home Medications Prior to Admission medications   Medication Sig Start Date End Date Taking? Authorizing Provider  ondansetron (ZOFRAN ODT) 4 MG disintegrating tablet Take 1 tablet (4 mg total) by mouth every 8 (eight) hours as needed for nausea or vomiting. 08/24/18   Michela Pitcher A, PA-C  Prenatal Vit-Fe Fumarate-FA (PRENATAL MULTIVITAMIN) TABS tablet Take 1 tablet by  mouth daily at 12 noon.    [provider]  terconazole (TERAZOL 7) 0.4 % vaginal cream Place 1 applicator vaginally at bedtime. 07/02/21   Gerrit Heck, CNM    Allergies    Penicillins and Amoxicillin  Review of Systems   Review of Systems  Constitutional:  Negative for chills and fever.  Respiratory:  Positive for shortness of breath. Negative for cough.   Cardiovascular:  Positive for chest pain.  Gastrointestinal:  Positive for abdominal pain and constipation. Negative for diarrhea, nausea and vomiting.  All other systems reviewed and are negative.  Physical Exam Updated Vital Signs BP 103/75   Pulse 68   Temp 97.9 F (36.6 C) (Oral)   Resp 19   Ht 5\' 1"  (1.549 m)   Wt 40 kg   LMP 05/14/2021   SpO2 100%   BMI 16.66 kg/m   Physical Exam Vitals and nursing note reviewed.  Constitutional:      Appearance: She is not ill-appearing or diaphoretic.     Comments: Very thin AA female  HENT:     Head: Normocephalic and atraumatic.  Eyes:      Conjunctiva/sclera: Conjunctivae normal.  Cardiovascular:     Rate and Rhythm: Normal rate and regular rhythm.     Heart sounds: Normal heart sounds.  Pulmonary:     Effort: Pulmonary effort is normal.     Breath sounds: Normal breath sounds. No wheezing, rhonchi or rales.  Chest:     Chest wall: Tenderness present.  Abdominal:     General: Abdomen is flat.     Palpations: Abdomen is soft.     Tenderness: There is abdominal tenderness in the right upper quadrant. There is no guarding or rebound.  Musculoskeletal:     Cervical back: Neck supple.  Skin:    General: Skin is warm and dry.  Neurological:     Mental Status: She is alert.    ED Results / Procedures / Treatments   Labs (all labs ordered are listed, but only abnormal results are displayed) Labs Reviewed  COMPREHENSIVE METABOLIC PANEL - Abnormal; Notable for the following components:      Result Value   Alkaline Phosphatase 37 (*)    All other components within normal limits  D-DIMER, QUANTITATIVE - Abnormal; Notable for the following components:   D-Dimer, Quant 1.96 (*)    All other components within normal limits  CBC WITH DIFFERENTIAL/PLATELET  LIPASE, BLOOD  URINALYSIS, ROUTINE W REFLEX MICROSCOPIC  I-STAT BETA HCG BLOOD, ED ([facility], [facility], [facility] ONLY)  TROPONIN I (HIGH SENSITIVITY)    EKG EKG Interpretation  Date/Time:  Tuesday August 21 2021 21:02:15 EDT Ventricular Rate:  57 PR Interval:  184 QRS Duration: 83 QT Interval:  407 QTC Calculation: 397 R Axis:   88 Text Interpretation: Sinus rhythm ST elev, probable normal early repol pattern No old tracing to compare Confirmed by 04-03-1979 (Eber Hong) on 08/21/2021 9:04:01 PM  Radiology CT Angio Chest PE W/Cm &/Or Wo Cm  Result Date: 08/21/2021 CLINICAL DATA:  Pulmonary embolus suspected with low to intermediate probability. Positive D-dimer. EXAM: CT ANGIOGRAPHY CHEST WITH CONTRAST TECHNIQUE: Multidetector CT imaging of the chest was performed using the  standard protocol during bolus administration of intravenous contrast. Multiplanar CT image reconstructions and MIPs were obtained to evaluate the vascular anatomy. CONTRAST:  28mL OMNIPAQUE IOHEXOL 350 MG/ML SOLN COMPARISON:  None. FINDINGS: Cardiovascular: There is good opacification of the central and segmental pulmonary arteries. No focal filling defects. No evidence of  significant pulmonary embolus. Normal heart size. No pericardial effusions. Normal caliber thoracic aorta. No dissection. Great vessel origins are patent. Mediastinum/Nodes: No enlarged mediastinal, hilar, or axillary lymph nodes. Thyroid gland, trachea, and esophagus demonstrate no significant findings. Lungs/Pleura: Mild emphysematous changes in the lung apices. Lungs are otherwise clear and expanded. No pleural effusions. No pneumothorax. Upper Abdomen: No acute abnormalities demonstrated in the visualized upper abdomen. Musculoskeletal: No chest wall abnormality. No acute or significant osseous findings. Review of the MIP images confirms the above findings. IMPRESSION: 1. No evidence of significant pulmonary embolus. 2. No evidence of active pulmonary disease. Electronically Signed   By: Burman Nieves M.D.   On: 08/21/2021 22:46   US Abdomen Limited RUQ (LIVER/GB)  Result Date: 08/21/2021 CLINICAL DATA:  Right upper quadrant pain. EXAM: ULTRASOUND ABDOMEN LIMITED RIGHT UPPER QUADRANT COMPARISON:  None. FINDINGS: Gallbladder: No gallstones or wall thickening visualized. No sonographic Murphy sign noted by sonographer. Common bile duct: Diameter: 1.9 mm Liver: No focal lesion identified. Within normal limits in parenchymal echogenicity. Portal vein is patent on color Doppler imaging with normal direction of blood flow towards the liver. Other: None. IMPRESSION: 1. Normal right upper quadrant ultrasound. Electronically Signed   By: Darliss Cheney M.D.   On: 08/21/2021 20:11    Procedures Procedures   Medications Ordered in  ED Medications  iohexol (OMNIPAQUE) 350 MG/ML injection 80 mL (80 mLs Intravenous Contrast Given 08/21/21 2238)    ED Course  I have reviewed the triage vital signs and the nursing notes.  Pertinent labs & imaging results that were available during my care of the patient were reviewed by me and considered in my medical decision making (see chart for details).  Clinical Course as of 08/21/21 2252  Tue Aug 21, 2021  2152 D-Dimer, Quant(!): 1.96 [MV]    Clinical Course User Index [MV] Tanda Rockers, [place]   MDM Rules/Calculators/A&P                           25 year old female who presents to the ED today with complaint of right-sided abdominal/chest pain as well as shortness of breath, worsened with exertion and deep inspiration for the past 2 to 3 weeks.  Seen at urgent care and told that her gallbladder was inflamed and informed that she would need a CT scan for further evaluation prompting ED visit today.  On arrival to the ED vitals are stable.  Patient is afebrile, nontachycardic and nontachypneic.  Satting 100% on room air.  She had labs done while in the waiting room including a CBC, CMP, lipase without any significant abnormalities.  A CT abdomen and pelvis was ordered by triage PA however subsequently discontinued by myself and a right upper quadrant ultrasound ordered to avoid unnecessary radiation to a young 25 year old female.  Right upper quadrant ultrasound negative at this time.  On further evaluation patient is noted to have some right upper quadrant tenderness as well as right-sided chest wall tenderness palpation.  She points underneath her right ribs when describing pain.  Her pain again is worsened with deep inspiration.  Subsequently it also appears that she was pregnant about 1 month ago however suffered a miscarriage.  Question possible PE causing her pain at this time given unequivocal ultrasound.  She has no other abdominal tenderness palpation and given no complaints of  nausea or vomiting I have lower suspicion for intra-abdominal infection at this time.  We will plan for D-dimer and EKG  and further evaluation.   D dimer positive at 1.96. Will plan for CTA at this time and EKG. Will also add on troponin.   EKG with likely early repol Troponin < 2 CTA negative at this time. Workup overall reassuring today - will discharge home with PCP follow up. Question biliary colic? Pt instructed on Ibuprofen as needed for pain and strict return precautions to the ED. She is in agreement with plan and stable for discharge home.   This note was prepared using Dragon voice recognition software and may include unintentional dictation errors due to the inherent limitations of voice recognition software.   Final Clinical Impression(s) / ED Diagnoses Final diagnoses:  RUQ abdominal pain    Rx / DC Orders ED Discharge Orders     None        Discharge Instructions      Your workup was overall reassuring in the ED today without any obvious findings in your labwork or CT scans.   I would recommend Ibuprofen and Tylenol as needed for pain and following up with your PCP for further eval. If you do not have a PCP you can follow up with [facility] for primary care needs.   Return to the ED for any new/worsening symptoms       Tanda Rockers, Cordelia Poche 08/21/21 2252    Eber Hong, MD 08/26/21 1504

## 2021-08-21 NOTE — ED Notes (Signed)
Patient transported to CT 

## 2021-08-21 NOTE — ED Notes (Signed)
Pt given another blanket and phone charger plugged in. Aware that she will get a CT scan. Denies further needs.

## 2021-08-21 NOTE — ED Notes (Signed)
Pt not back from waiting room. [place] came to get her so I told them to go get her from the waiting room.

## 2022-04-02 ENCOUNTER — Other Ambulatory Visit: Payer: Self-pay

## 2022-04-02 ENCOUNTER — Encounter (HOSPITAL_COMMUNITY): Payer: Self-pay

## 2022-04-02 ENCOUNTER — Emergency Department (HOSPITAL_COMMUNITY)
Admission: EM | Admit: 2022-04-02 | Discharge: 2022-04-02 | Disposition: A | Discharge status: Home / Self Care | Payer: Medicaid Other | Attending: Emergency Medicine | Admitting: Emergency Medicine

## 2022-04-02 DIAGNOSIS — S025XXA Fracture of tooth (traumatic), initial encounter for closed fracture: Secondary | ICD-10-CM | POA: Insufficient documentation

## 2022-04-02 DIAGNOSIS — X509XXA Other and unspecified overexertion or strenuous movements or postures, initial encounter: Secondary | ICD-10-CM | POA: Insufficient documentation

## 2022-04-02 DIAGNOSIS — K029 Dental caries, unspecified: Secondary | ICD-10-CM | POA: Insufficient documentation

## 2022-04-02 MED ORDER — LIDOCAINE VISCOUS HCL 2 % MT SOLN: 15.0000 mL | OROMUCOSAL | 1 refills | Status: DC | PRN

## 2022-04-02 NOTE — ED Provider Notes (Signed)
?[facility] EMERGENCY DEPARTMENT ?Provider Note ? ? ?CSN: [id number] ?Arrival date & time: 04/02/22  1101 ? ?  ? ?History ? ?Chief Complaint  ?Patient presents with  ? Dental Pain  ? ? ?Alexis Gill is a 26 y.o. female presenting due to dental pain.  She reports that in September one of her right upper molars broke off when she was trying to open a pack of nail polish.  Says that over the last months it is continued to fall out however over the last couple days it has become painful.  She wants to go to a dentist to have it removed but she was told she needed to come here first for medication by her mother.  Has been using lidocaine solution that her sister had which seems to numb all the areas but not help the nerves around the molar. ? ? ?Dental Pain ? ?  ? ?Home Medications ?Prior to Admission medications   ?Medication Sig Start Date End Date Taking? Authorizing Provider  ?lidocaine (XYLOCAINE) 2 % solution Use as directed 15 mLs in the mouth or throat as needed for mouth pain. 04/02/22  Yes Lissie Hinesley A, PA-C  ?ondansetron (ZOFRAN ODT) 4 MG disintegrating tablet Take 1 tablet (4 mg total) by mouth every 8 (eight) hours as needed for nausea or vomiting. 08/24/18   Michela Pitcher A, PA-C  ?Prenatal Vit-Fe Fumarate-FA (PRENATAL MULTIVITAMIN) TABS tablet Take 1 tablet by mouth daily at 12 noon.    [provider]  ?terconazole (TERAZOL 7) 0.4 % vaginal cream Place 1 applicator vaginally at bedtime. 07/02/21   Gerrit Heck, CNM  ?   ? ?Allergies    ?Penicillins and Amoxicillin   ? ?Review of Systems   ?Review of Systems ? ?Physical Exam ?Updated Vital Signs ?BP 130/83 (BP Location: Right Arm)   Pulse 72   Temp 98.8 ?F (37.1 ?C) (Oral)   Resp 14   Ht 5\' 1"  (1.549 m)   Wt 40.4 kg   LMP 05/14/2021   SpO2 100%   BMI 16.82 kg/m?  ?Physical Exam ?Vitals and nursing note reviewed.  ?Constitutional:   ?   Appearance: Normal appearance.  ?HENT:  ?   Head: Normocephalic and atraumatic.  ?    Mouth/Throat:  ? ?   Comments: Broken right upper molar, majority of teeth with dental caries.  Tolerating secretions, airway clear.  No signs of infection, no abscess or bleeding.  No signs of infection.   ?Eyes:  ?   General: No scleral icterus. ?   Conjunctiva/sclera: Conjunctivae normal.  ?Pulmonary:  ?   Effort: Pulmonary effort is normal. No respiratory distress.  ?Skin: ?   Findings: No rash.  ?Neurological:  ?   Mental Status: She is alert.  ?Psychiatric:     ?   Mood and Affect: Mood normal.  ? ? ?ED Results / Procedures / Treatments   ?Labs ?(all labs ordered are listed, but only abnormal results are displayed) ?Labs Reviewed - No data to display ? ?EKG ?None ? ?Radiology ?No results found. ? ?Procedures ?Procedures  ? ?Medications Ordered in ED ?Medications - No data to display ? ?ED Course/ Medical Decision Making/ A&P ?  ?                        ?Medical Decision Making ?Risk ?Prescription drug management. ? ? ? 26 year old female presented with a broken tooth. Tolerating oral secretions.  Patient does not need  antibiotics today however she needs to see a dentist as soon as possible. She was given resources and prescribed her own licocaine solution ? ?ED Diagnoses ?Final diagnoses:  ?Dental caries  ?Closed fracture of tooth, initial encounter  ? ? ?Rx / DC Orders ?ED Discharge Orders   ? ?      Ordered  ?  lidocaine (XYLOCAINE) 2 % solution  As needed       ? 04/02/22 1131  ? ?  ?  ? ?  ? ?Results and diagnoses were explained to the patient. Return precautions discussed in full. Patient had no additional questions and expressed complete understanding. ? ? ?This chart was dictated using voice recognition software.  Despite best efforts to proofread,  errors can occur which can change the documentation meaning.  ?  ?Saddie Benders, PA-C ?04/02/22 1428 ? ?  ?Wynetta Fines, MD ?04/03/22 [phone] ? ?

## 2022-04-02 NOTE — Discharge Instructions (Addendum)
There is a list of dentists attached to these discharge papers for you to try and get in touch with.  If not, it is important for you to follow-up and pay out-of-pocket so that your tooth does not become infected. ?

## 2022-04-02 NOTE — ED Triage Notes (Signed)
Pt arrived POV from home c/o a toothache. Pt states she started having issues with a tooth on the top right side in the back started breaking off and she put it off. But now she feels like it is swollen and the pain is traveling into her jaw and ear.  ?

## 2022-06-24 ENCOUNTER — Ambulatory Visit (HOSPITAL_COMMUNITY)
Admission: EM | Admit: 2022-06-24 | Discharge: 2022-06-24 | Disposition: A | Discharge status: Home / Self Care | Payer: Medicaid Other | Attending: Physician Assistant | Admitting: Physician Assistant

## 2022-06-24 ENCOUNTER — Encounter (HOSPITAL_COMMUNITY): Payer: Self-pay

## 2022-06-24 DIAGNOSIS — G510 Bell's palsy: Secondary | ICD-10-CM

## 2022-06-24 MED ORDER — VALACYCLOVIR HCL 1 G PO TABS: 1000.0000 mg | ORAL_TABLET | Freq: Three times a day (TID) | ORAL | 0 refills | Status: AC

## 2022-06-24 MED ORDER — PREDNISONE 20 MG PO TABS: 60.0000 mg | ORAL_TABLET | Freq: Every day | ORAL | 0 refills | Status: AC

## 2022-06-24 NOTE — ED Provider Notes (Signed)
[facility]    CSN: [id number] Arrival date & time: 06/24/22  1926      History   Chief Complaint Chief Complaint  Patient presents with   Facial Droop    HPI Alexis Gill is a 26 y.o. female.   Patient presents today with a 2-day history of left-sided facial droop.  She reports initially she had some numbness in her tongue but denies any numbness or paresthesias in any other parts of her body.  Denies any dysarthria, nausea, vomiting, headache, visual disturbance.  Denies any medication changes or recent head injury.  Denies history of Bell's palsy.  Denies any previous tick bite or recent illness.  She does report some burning and tearing in her left eye but denies any change in her vision.  She denies any recent illness or rash.  She denies history of neurological condition.  She is confident that she is not pregnant.  She denies history of diabetes.  She has not tried any over-the-counter medication for symptom management.    Past Medical History:  Diagnosis Date   Medical history non-contributory     There are no problems to display for this patient.   Past Surgical History:  Procedure Laterality Date   NO PAST SURGERIES      OB History     Gravida  2   Para      Term      Preterm      AB  1   Living         SAB      IAB      Ectopic      Multiple      Live Births               Home Medications    Prior to Admission medications   Medication Sig Start Date End Date Taking? Authorizing Provider  predniSONE (DELTASONE) 20 MG tablet Take 3 tablets (60 mg total) by mouth daily for 7 days. 06/24/22 07/01/22 Yes Ailanie Ruttan, Derry Skill, PA-C  valACYclovir (VALTREX) 1000 MG tablet Take 1 tablet (1,000 mg total) by mouth 3 (three) times daily for 7 days. 06/24/22 07/01/22 Yes Iley Deignan, Derry Skill, PA-C    Family History No family history on file.  Social History Social History   Tobacco Use   Smoking status: Every Day   Smokeless tobacco:  Never  Vaping Use   Vaping Use: Every day  Substance Use Topics   Alcohol use: No   Drug use: Yes    Types: Marijuana     Allergies   Penicillins and Amoxicillin   Review of Systems Review of Systems  Constitutional:  Positive for activity change. Negative for appetite change, fatigue and fever.  Eyes:  Positive for discharge and itching. Negative for photophobia, pain, redness and visual disturbance.  Respiratory:  Negative for cough and shortness of breath.   Cardiovascular:  Negative for chest pain.  Gastrointestinal:  Negative for abdominal pain, diarrhea, nausea and vomiting.  Neurological:  Positive for facial asymmetry. Negative for dizziness, seizures, syncope, speech difficulty, weakness, light-headedness, numbness and headaches.     Physical Exam Triage Vital Signs ED Triage Vitals  Enc Vitals Group     BP 06/24/22 1933 98/67     Pulse Rate 06/24/22 1933 85     Resp 06/24/22 1933 18     Temp 06/24/22 1933 98.5 F (36.9 C)     Temp src --      SpO2 06/24/22  1933 97 %     Weight --      Height --      Head Circumference --      Peak Flow --      Pain Score 06/24/22 1937 0     Pain Loc --      Pain Edu? --      Excl. in GC? --    No data found.  Updated Vital Signs BP 98/67   Pulse 85   Temp 98.5 F (36.9 C)   Resp 18   LMP 05/26/2022   SpO2 97%   Visual Acuity Right Eye Distance:   Left Eye Distance:   Bilateral Distance:    Right Eye Near:   Left Eye Near:    Bilateral Near:     Physical Exam Vitals reviewed.  Constitutional:      General: She is awake. She is not in acute distress.    Appearance: Normal appearance. She is well-developed. She is not ill-appearing.     Comments: Very pleasant female appears stated age in no acute distress sitting comfortably in exam room  HENT:     Head: Normocephalic and atraumatic. No raccoon eyes, Battle's sign or contusion.     Right Ear: Tympanic membrane, ear canal and external ear normal. No  hemotympanum.     Left Ear: Tympanic membrane, ear canal and external ear normal. No hemotympanum.     Nose: Nose normal.     Mouth/Throat:     Tongue: Tongue does not deviate from midline.     Pharynx: Uvula midline. No oropharyngeal exudate or posterior oropharyngeal erythema.  Eyes:     General: No visual field deficit.    Extraocular Movements: Extraocular movements intact.     Conjunctiva/sclera: Conjunctivae normal.     Pupils: Pupils are equal, round, and reactive to light.     Comments: Patient unable to completely close left eye  Cardiovascular:     Rate and Rhythm: Normal rate and regular rhythm.     Heart sounds: Normal heart sounds, S1 normal and S2 normal. No murmur heard. Pulmonary:     Effort: Pulmonary effort is normal.     Breath sounds: Normal breath sounds. No wheezing, rhonchi or rales.     Comments: Clear to auscultation bilaterally Abdominal:     Palpations: Abdomen is soft.     Tenderness: There is no abdominal tenderness.  Musculoskeletal:     Cervical back: Normal range of motion and neck supple. No spinous process tenderness or muscular tenderness.     Comments: Strength 5/5 bilateral upper and lower extremities  Lymphadenopathy:     Head:     Right side of head: No submental, submandibular or tonsillar adenopathy.     Left side of head: No submental, submandibular or tonsillar adenopathy.  Neurological:     General: No focal deficit present.     Mental Status: She is alert.     Cranial Nerves: Cranial nerve deficit and facial asymmetry present.     Motor: Motor function is intact.     Coordination: Coordination is intact.     Gait: Gait is intact.     Comments: Left-sided facial asymmetry with inability to raise left eyebrow.  Psychiatric:        Behavior: Behavior is cooperative.      UC Treatments / Results  Labs (all labs ordered are listed, but only abnormal results are displayed) Labs Reviewed - No data to display  EKG   Radiology  No  results found.  Procedures Procedures (including critical care time)  Medications Ordered in UC Medications - No data to display  Initial Impression / Assessment and Plan / UC Course  I have reviewed the triage vital signs and the nursing notes.  Pertinent labs & imaging results that were available during my care of the patient were reviewed by me and considered in my medical decision making (see chart for details).     Symptoms consistent with Bell's palsy.  Patient started on high-dose prednisone of 60 mg for 1 week as well as Valtrex.  She was instructed not to take NSAIDs with prednisone due to risk of GI bleeding.  Discussed that she can end up with a corneal abrasion if she is not careful about keeping eye closed at night.  Recommended that she keep her eyes closed and use lubricating drops/ointment for additional symptom relief.  Recommended follow-up with neurology.  She was given contact information for local provider with instruction to call to schedule an appointment.  Discussed that if she has any worsening symptoms she needs to go to the emergency room immediately.  Strict return precautions given.  Offered work excuse note which patient declined.  Final Clinical Impressions(s) / UC Diagnoses   Final diagnoses:  Bell's palsy     Discharge Instructions      Start prednisone 3 tablets in the morning for 1 week.  Do not take NSAIDs including aspirin, ibuprofen/Advil, naproxen/Aleve with this medication.  Take Valtrex 3 times daily.  Make sure to tape your eye closed at night and use lubricating eyedrops to prevent it from becoming irritated due to the dryness since you cannot close it completely.  I do recommend you follow-up with neurology.  Please call to schedule an appointment.  If anything worsens you need to go to the emergency room.    ED Prescriptions     Medication Sig Dispense Auth. Provider   predniSONE (DELTASONE) 20 MG tablet Take 3 tablets (60 mg total) by  mouth daily for 7 days. 21 tablet Meylin Stenzel K, PA-C   valACYclovir (VALTREX) 1000 MG tablet Take 1 tablet (1,000 mg total) by mouth 3 (three) times daily for 7 days. 21 tablet Kelly Eisler, Noberto Retort, PA-C      PDMP not reviewed this encounter.   Jeani Hawking, PA-C 06/24/22 2037

## 2022-06-24 NOTE — Discharge Instructions (Signed)
Start prednisone 3 tablets in the morning for 1 week.  Do not take NSAIDs including aspirin, ibuprofen/Advil, naproxen/Aleve with this medication.  Take Valtrex 3 times daily.  Make sure to tape your eye closed at night and use lubricating eyedrops to prevent it from becoming irritated due to the dryness since you cannot close it completely.  I do recommend you follow-up with neurology.  Please call to schedule an appointment.  If anything worsens you need to go to the emergency room.

## 2022-06-24 NOTE — ED Triage Notes (Signed)
Pt reports that she started on Saturday with the left side of her tongue going numb and left side of face is drooping. Pt is unable to raise her lt eyebrow. Pt states that prior to the numbness in her face she had a metallic taste in her mouth. Pt denies any other numbness or weakness.

## 2022-07-10 ENCOUNTER — Ambulatory Visit: Payer: Medicaid Other | Admitting: Neurology

## 2022-11-05 ENCOUNTER — Encounter (HOSPITAL_COMMUNITY): Payer: Self-pay

## 2022-11-05 ENCOUNTER — Emergency Department (HOSPITAL_COMMUNITY)
Admission: EM | Admit: 2022-11-05 | Discharge: 2022-11-05 | Disposition: A | Discharge status: Home / Self Care | Payer: Medicaid Other | Attending: Emergency Medicine | Admitting: Emergency Medicine

## 2022-11-05 ENCOUNTER — Other Ambulatory Visit: Payer: Self-pay

## 2022-11-05 DIAGNOSIS — K029 Dental caries, unspecified: Secondary | ICD-10-CM | POA: Insufficient documentation

## 2022-11-05 DIAGNOSIS — K0889 Other specified disorders of teeth and supporting structures: Secondary | ICD-10-CM | POA: Diagnosis not present

## 2022-11-05 MED ORDER — NAPROXEN 500 MG PO TABS: 500.0000 mg | ORAL_TABLET | Freq: Two times a day (BID) | ORAL | 0 refills | Status: DC

## 2022-11-05 MED ORDER — CLINDAMYCIN HCL 150 MG PO CAPS: 300.0000 mg | ORAL_CAPSULE | Freq: Three times a day (TID) | ORAL | 0 refills | Status: DC

## 2022-11-05 MED ORDER — ACETAMINOPHEN 500 MG PO TABS
ORAL_TABLET | ORAL | Status: AC
  Filled 2022-11-05: qty 1

## 2022-11-05 MED ORDER — ACETAMINOPHEN 500 MG PO TABS
1000.0000 mg | ORAL_TABLET | Freq: Once | ORAL | Status: AC
  Administered 2022-11-05: 1000 mg via ORAL
  Filled 2022-11-05: qty 2

## 2022-11-05 NOTE — ED Provider Notes (Signed)
[facility] EMERGENCY DEPARTMENT Provider Note   CSN: [id number] Arrival date & time: 11/05/22  0045     History  Chief Complaint  Patient presents with   Dental Pain    Alexis Gill is a 26 y.o. female.  The history is provided by the patient and medical records.  Dental Pain  26 year old female here with left upper dental pain for the past 2 days after one of her teeth broke.  She reports significant pain with trying to eat or drink or when cold air touches it.  She has not had any fever.  No difficulty swallowing.  She is not currently established with a dentist.  No intervention tried prior to arrival.  Home Medications Prior to Admission medications   Not on File      Allergies    Penicillins and Amoxicillin    Review of Systems   Review of Systems  HENT:  Positive for dental problem.   All other systems reviewed and are negative.   Physical Exam Updated Vital Signs BP 112/87 (BP Location: Right Arm)   Pulse 76   Temp 98.8 F (37.1 C)   Resp 16   Ht 5\' 1"  (1.549 m)   Wt 39 kg   LMP 10/30/2022 (Approximate)   SpO2 96%   BMI 16.25 kg/m   Physical Exam Vitals and nursing note reviewed.  Constitutional:      Appearance: She is well-developed.  HENT:     Head: Normocephalic and atraumatic.     Mouth/Throat:     Comments: Teeth largely in fair dentition,  left upper 2nd molar broken centrally with decay present, surrounding gingiva normal in appearance, handling secretions appropriately, no trismus, no facial or neck swelling, normal phonation without stridor Eyes:     Conjunctiva/sclera: Conjunctivae normal.     Pupils: Pupils are equal, round, and reactive to light.  Cardiovascular:     Rate and Rhythm: Normal rate and regular rhythm.     Heart sounds: Normal heart sounds.  Pulmonary:     Effort: Pulmonary effort is normal. No respiratory distress.     Breath sounds: Normal breath sounds. No rhonchi.  Abdominal:     General:  Bowel sounds are normal.     Palpations: Abdomen is soft.     Tenderness: There is no abdominal tenderness. There is no rebound.  Musculoskeletal:        General: Normal range of motion.     Cervical back: Normal range of motion.  Skin:    General: Skin is warm and dry.  Neurological:     Mental Status: She is alert and oriented to person, place, and time.     ED Results / Procedures / Treatments   Labs (all labs ordered are listed, but only abnormal results are displayed) Labs Reviewed - No data to display  EKG None  Radiology No results found.  Procedures Procedures    Medications Ordered in ED Medications - No data to display  ED Course/ Medical Decision Making/ A&P                           Medical Decision Making Risk Prescription drug management.   26 year old female here with left upper dental pain secondary to broken left upper 2nd molar.  Does have central decay.  She does not have any fever.  No facial or neck swelling, handling secretions well, no stridor.  No signs or symptoms concerning  for Ludwig's angina.  Stable for discharge.  Will start abx for infection prophylaxis, she does have known penicillin allergy so we will start on clindamycin.  She is given referral for dentist on-call.  May return here for any new or acute changes.  Final Clinical Impression(s) / ED Diagnoses Final diagnoses:  Pain, dental    Rx / DC Orders ED Discharge Orders          Ordered    clindamycin (CLEOCIN) 150 MG capsule  3 times daily        11/05/22 0113    naproxen (NAPROSYN) 500 MG tablet  2 times daily        11/05/22 0113              Garlon Hatchet, PA-C 11/05/22 0123    Sabas Sous, MD 11/05/22 [phone]

## 2022-11-05 NOTE — Discharge Instructions (Signed)
Take the prescribed medication as directed. °Follow-up with dentist as soon as possible. °Return to the ED for new or worsening symptoms. °

## 2022-11-05 NOTE — ED Triage Notes (Signed)
Pt reports severe dental pain, states a tooth on the upper left side has broken in half.

## 2022-11-05 NOTE — ED Notes (Signed)
Pt A&OX4 ambulatory at d/c with independent steady gait, pt verbalized understanding of d/c instructions, prescriptions and follow up care.

## 2023-04-25 IMAGING — DX DG ABDOMEN ACUTE W/ 1V CHEST
3 series · 3 of 3 positions shown · non-contrast
Comparison: None.

CLINICAL DATA: Right upper quadrant pain

EXAM:
DG ABDOMEN ACUTE WITH 1 VIEW CHEST

[chest pa]
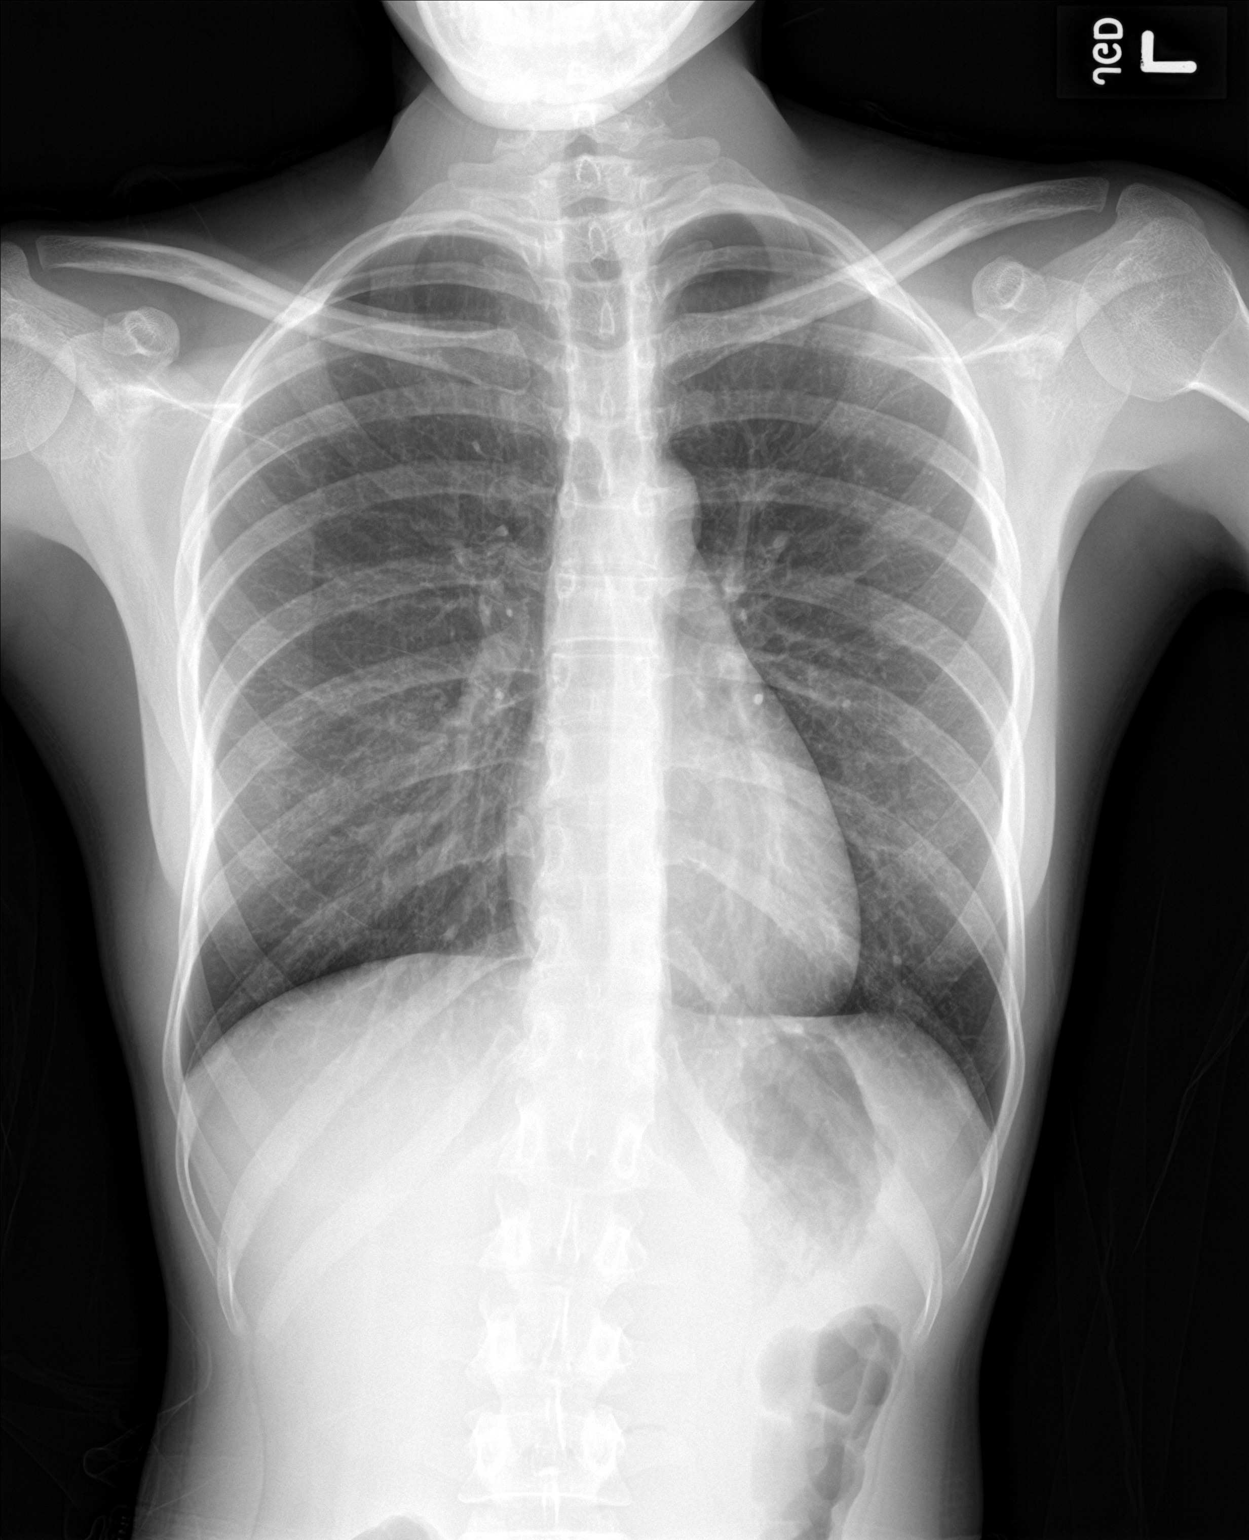

[abdomen erect]
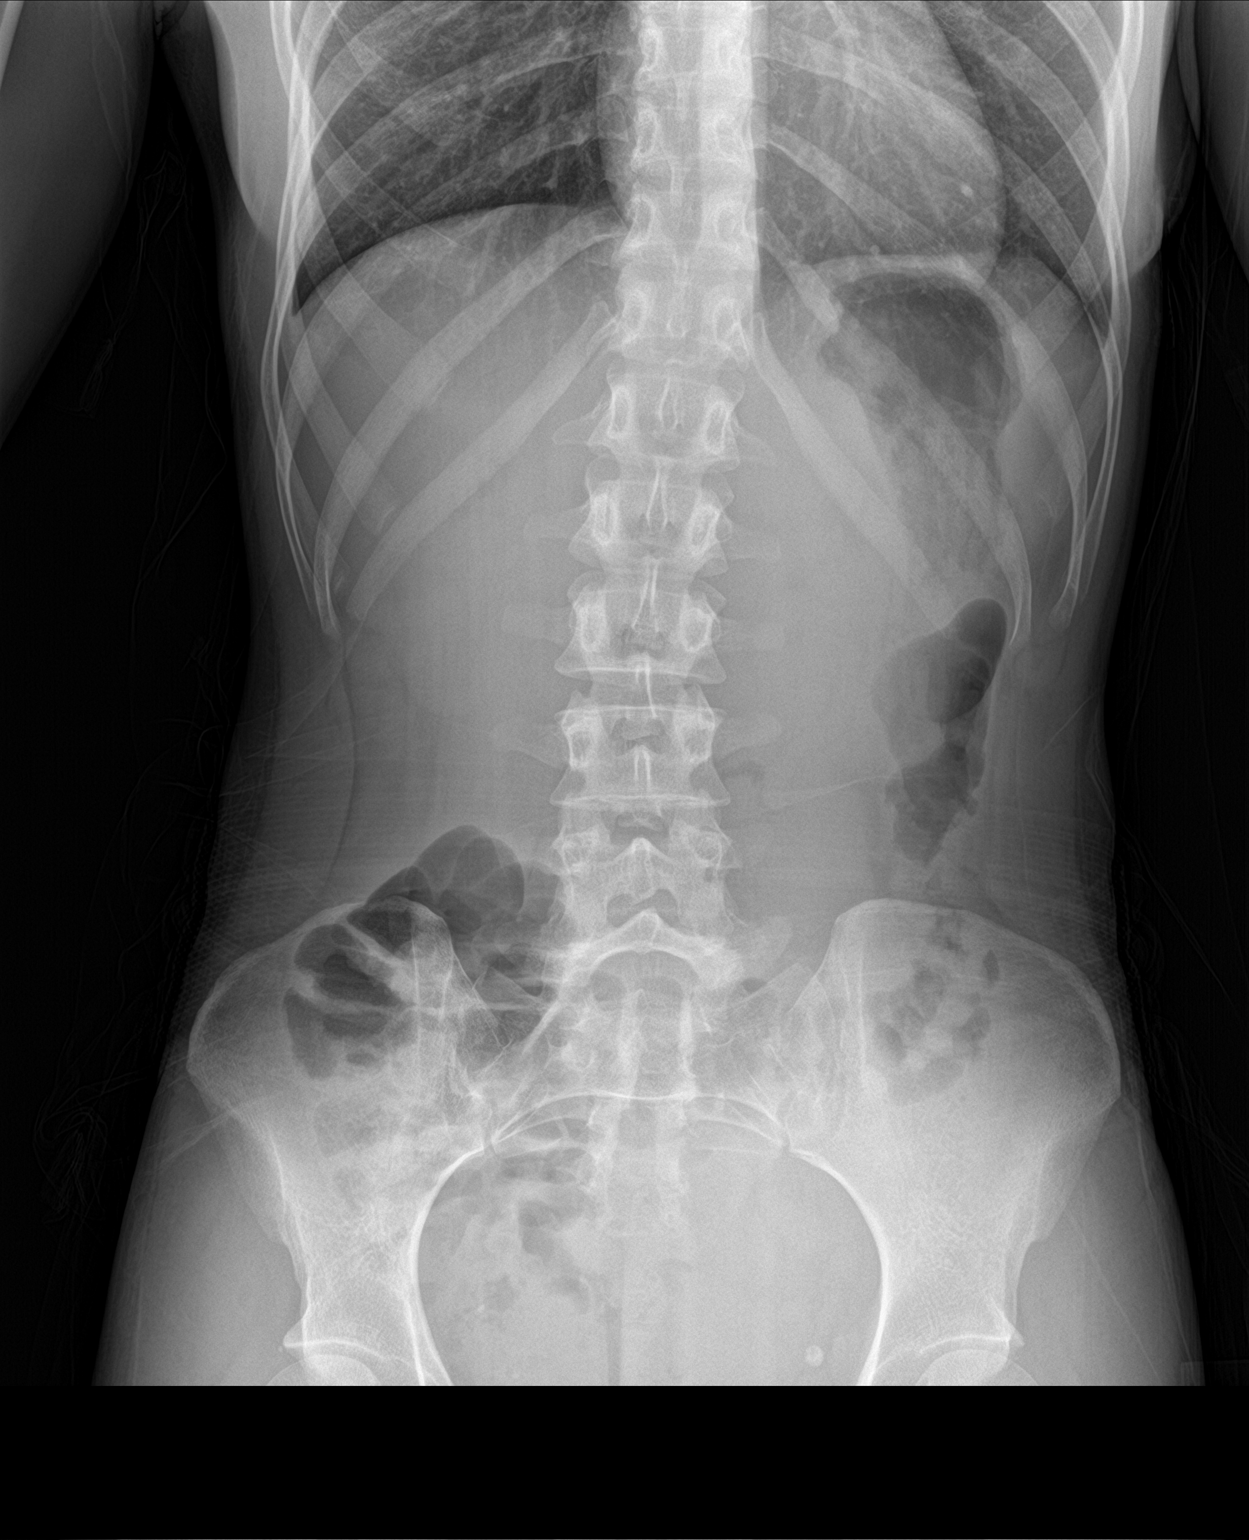

[abdomen supine]
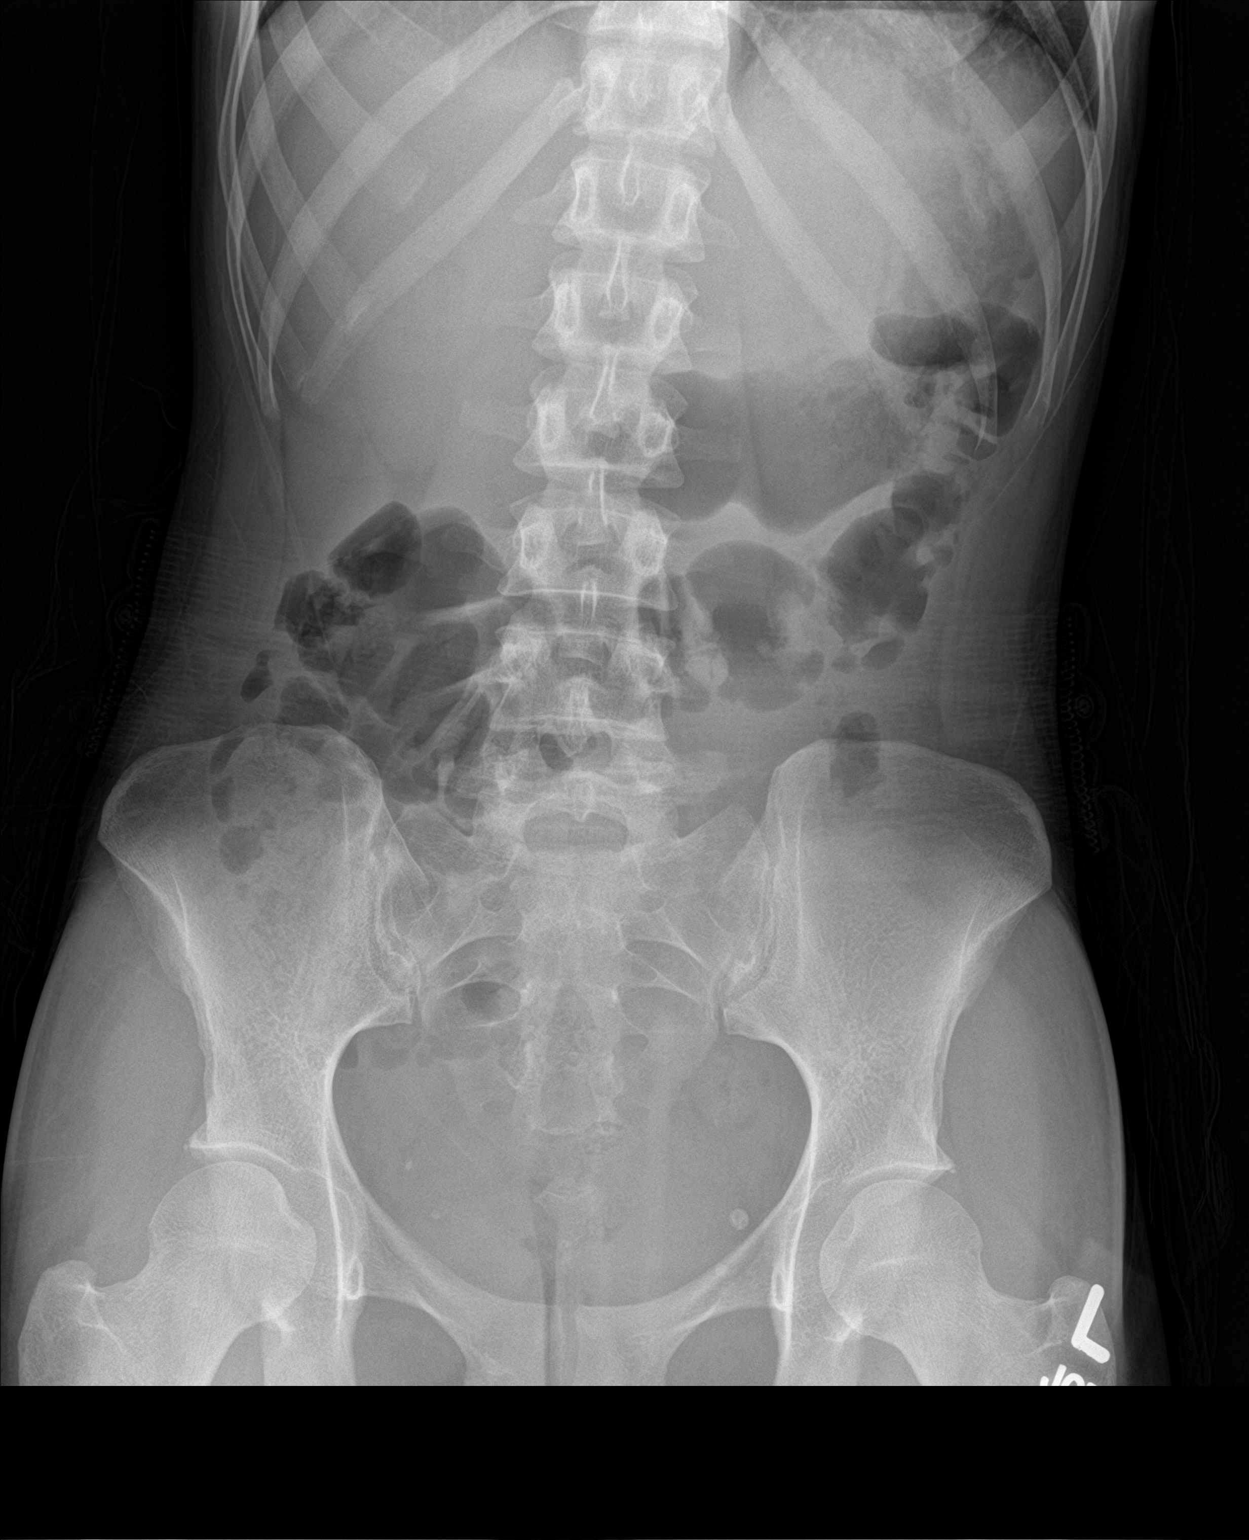

[3 of 3 positions shown; findings below may reference images not displayed]

FINDINGS: There is no evidence of dilated bowel loops or free intraperitoneal
air. No radiopaque calculi or other significant radiographic
abnormality is seen. Heart size and mediastinal contours are within
normal limits. Both lungs are clear.
IMPRESSION: Negative abdominal radiographs.  No acute cardiopulmonary disease.

## 2023-04-29 IMAGING — CT CT ANGIO CHEST
2 of 6 series · 19 of 36 positions shown · IV contrast (omnipaque)
Comparison: None.

CLINICAL DATA: Pulmonary embolus suspected with low to intermediate
probability. Positive D-dimer.

EXAM:
CT ANGIOGRAPHY CHEST WITH CONTRAST
TECHNIQUE: Multidetector CT imaging of the chest was performed using the
standard protocol during bolus administration of intravenous
contrast. Multiplanar CT image reconstructions and MIPs were
obtained to evaluate the vascular anatomy.
CONTRAST:  80mL OMNIPAQUE IOHEXOL 350 MG/ML SOLN

[Series 7: pe thins · axial · 0.59mm/px · z∈[-684,-457]mm · 18 of 362 slices shown]
[im 19/362  lung]
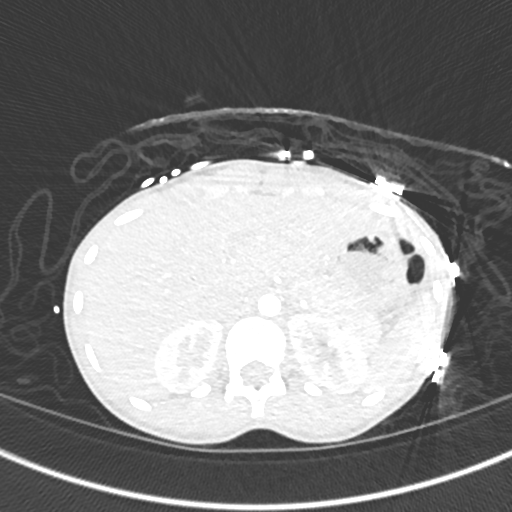
[im 37/362  mediastinal]
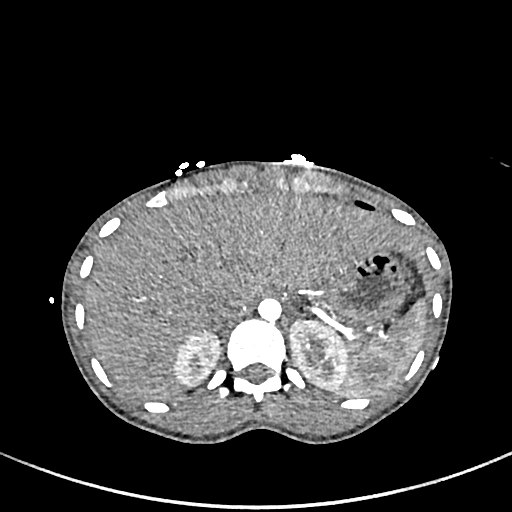
[im 55/362  lung]
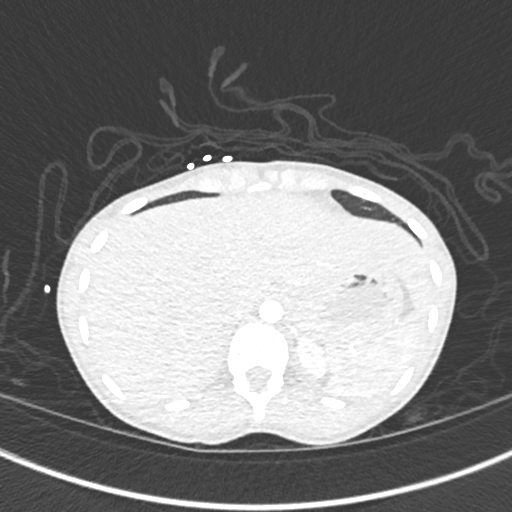
[im 73/362  mediastinal]
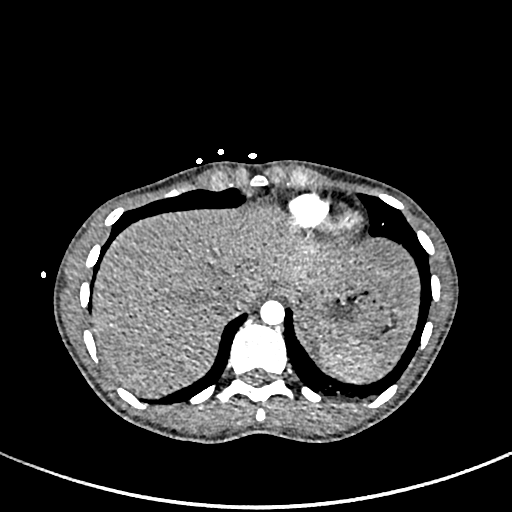
[im 91/362  lung]
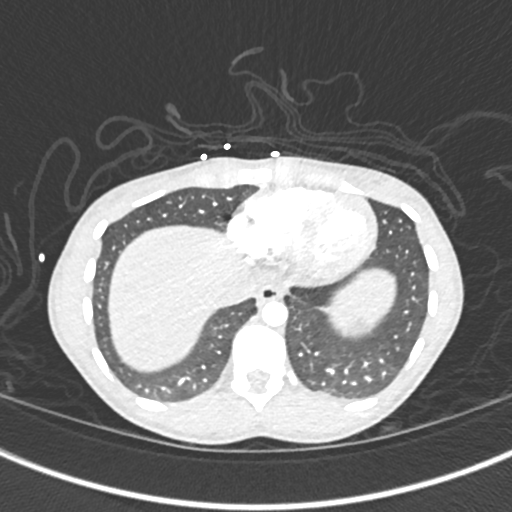
[im 109/362  mediastinal]
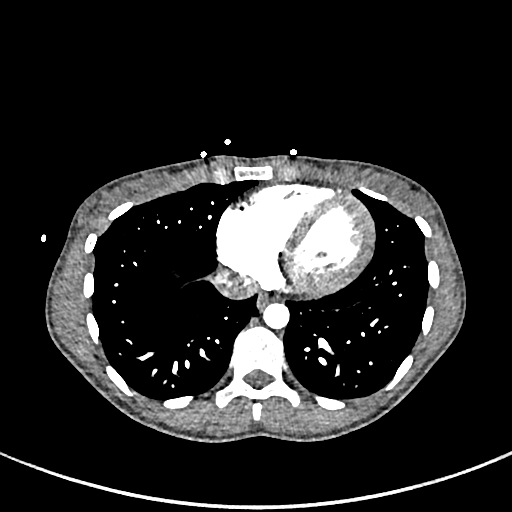
[im 127/362  lung]
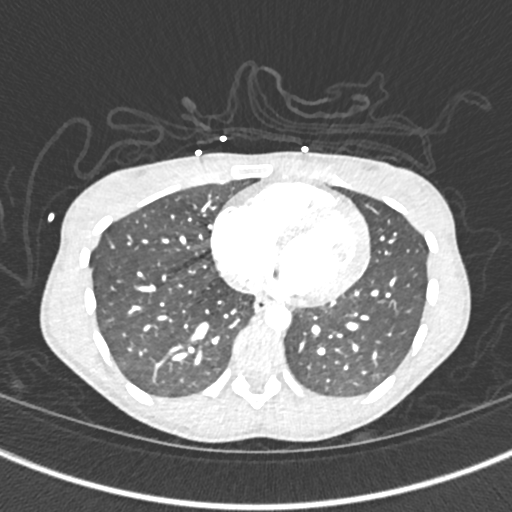
[im 145/362  mediastinal]
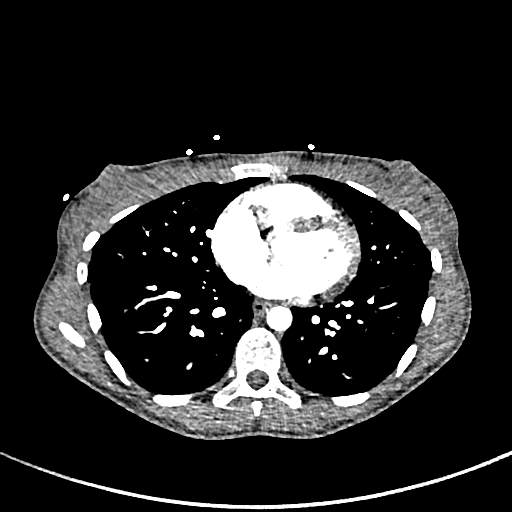
[im 163/362  lung]
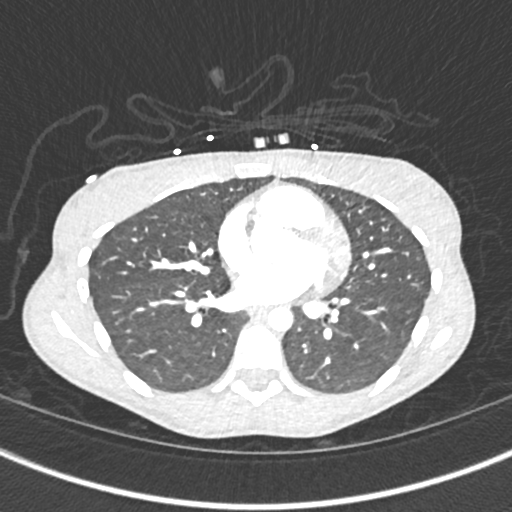
[im 199/362  mediastinal]
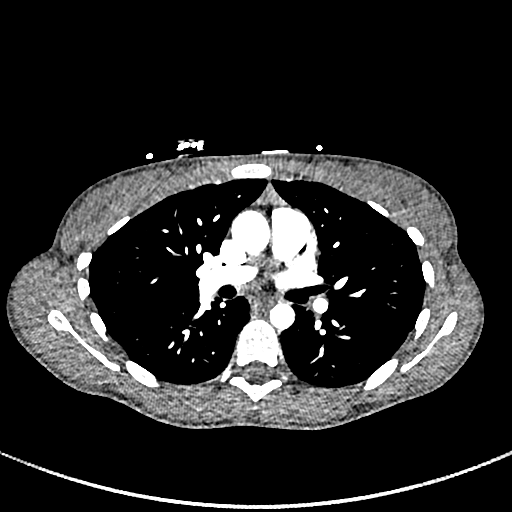
[im 217/362  lung]
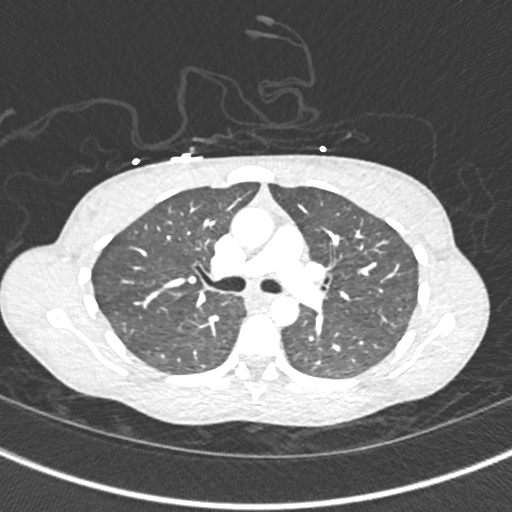
[im 235/362  mediastinal]
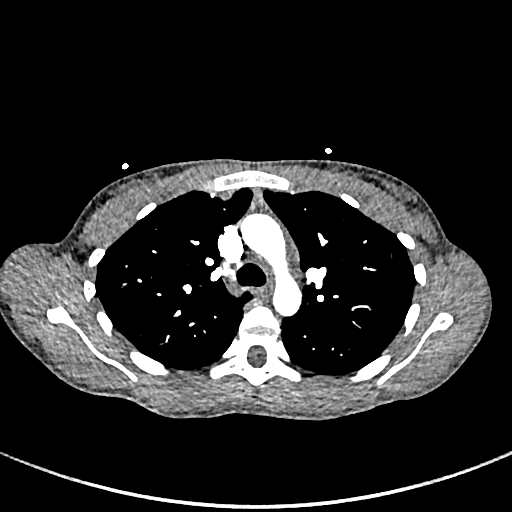
[im 253/362  lung]
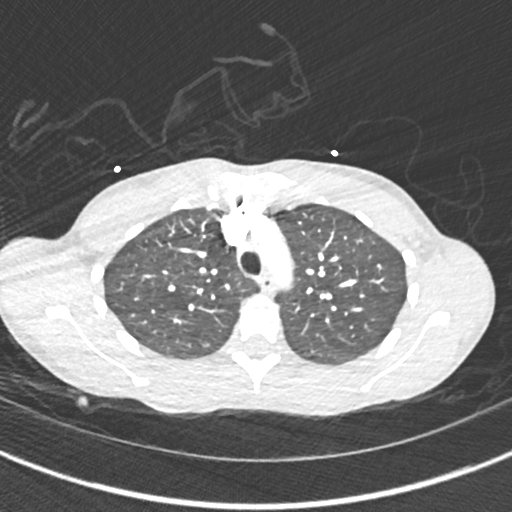
[im 271/362  mediastinal]
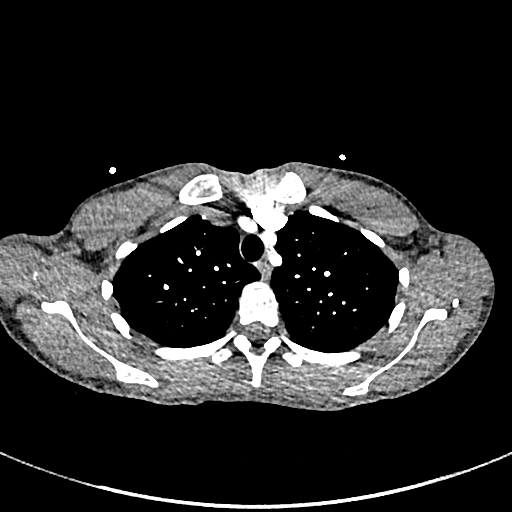
[im 289/362  lung]
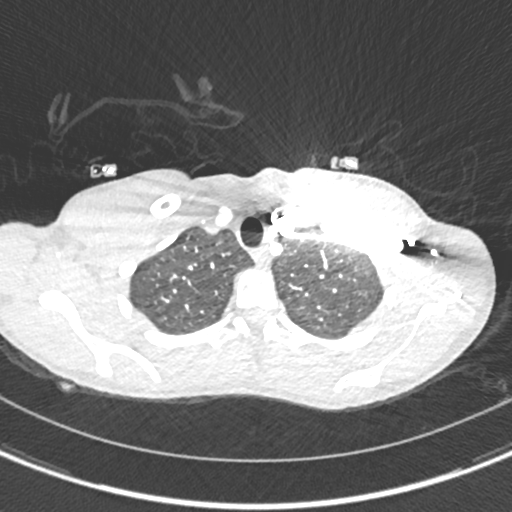
[im 307/362  mediastinal]
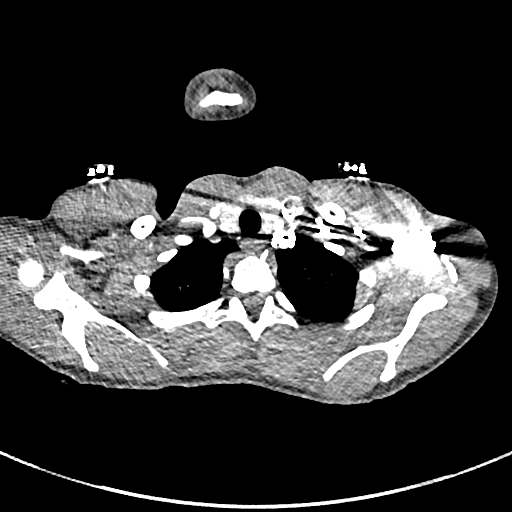
[im 325/362  lung]
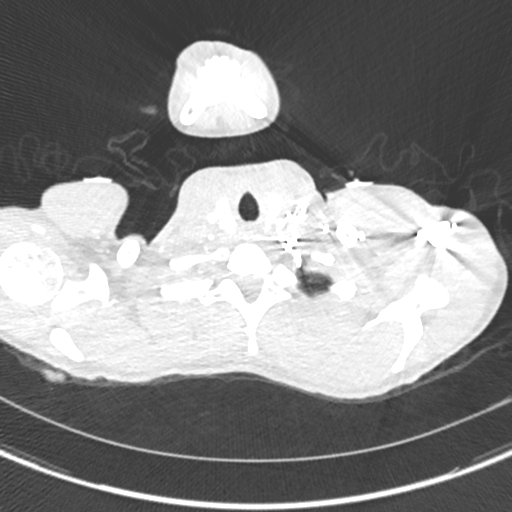
[im 343/362  mediastinal]
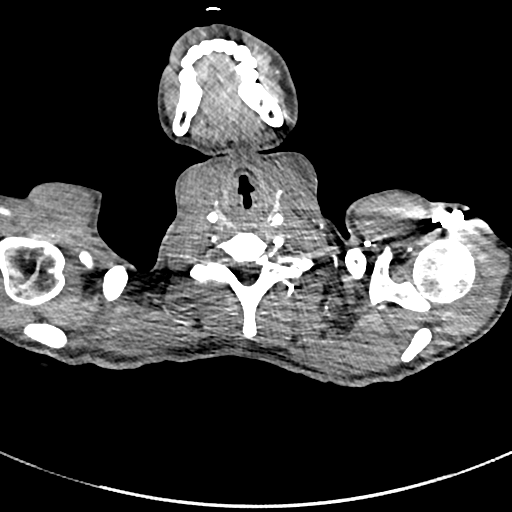

[Series 8: pe 2mm cor · coronal · 0.59mm/px · 1 of 83 slices shown]
[im 42/83  mediastinal]
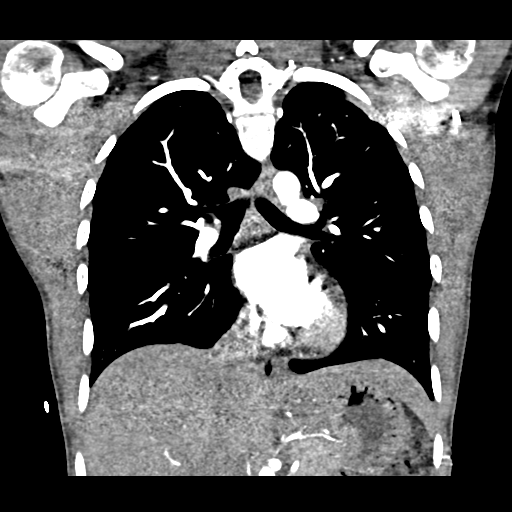

[19 of 36 positions shown; findings below may reference images not displayed]

FINDINGS: Cardiovascular: There is good opacification of the central and
segmental pulmonary arteries. No focal filling defects. No evidence
of significant pulmonary embolus. Normal heart size. No pericardial
effusions. Normal caliber thoracic aorta. No dissection. Great
vessel origins are patent.

Mediastinum/Nodes: No enlarged mediastinal, hilar, or axillary lymph
nodes. Thyroid gland, trachea, and esophagus demonstrate no
significant findings.

Lungs/Pleura: Mild emphysematous changes in the lung apices. Lungs
are otherwise clear and expanded. No pleural effusions. No
pneumothorax.

Upper Abdomen: No acute abnormalities demonstrated in the visualized
upper abdomen.

Musculoskeletal: No chest wall abnormality. No acute or significant
osseous findings.

Review of the MIP images confirms the above findings.
IMPRESSION: 1. No evidence of significant pulmonary embolus.
2. No evidence of active pulmonary disease.

## 2023-04-29 IMAGING — US US ABDOMEN LIMITED
1 series · 14 of 25 positions shown · non-contrast
Comparison: None.

CLINICAL DATA: Right upper quadrant pain.

EXAM:
ULTRASOUND ABDOMEN LIMITED RIGHT UPPER QUADRANT

[Series 1: us abdomen limited ruq (liver/gb) · 47 acquisitions, 14 frames shown]
[im 1/47]
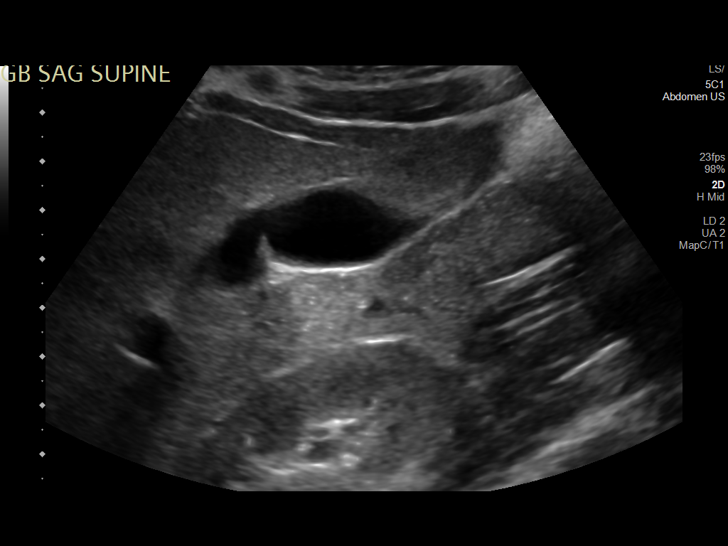
[im 4/47]
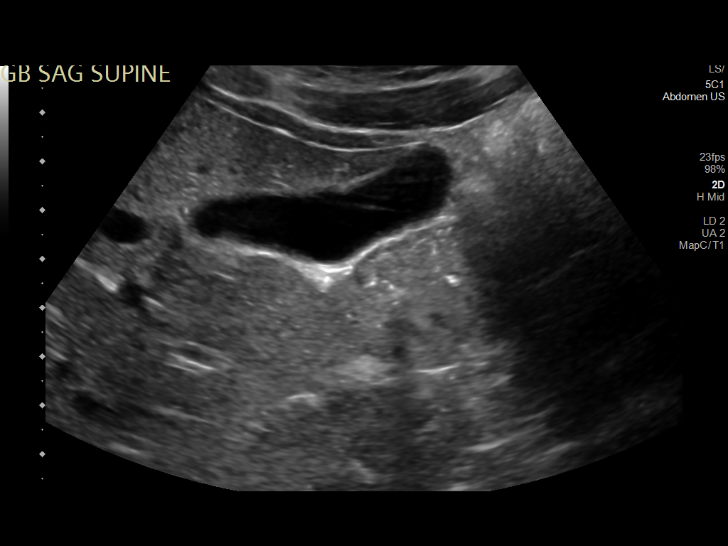
[im 8/47]
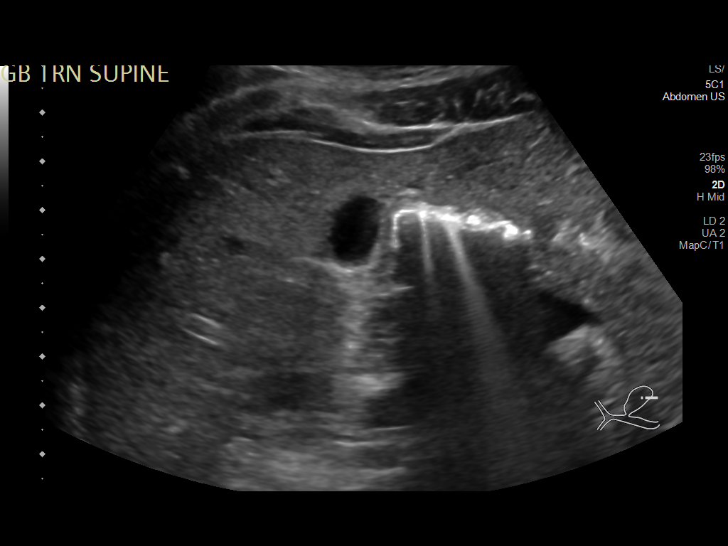
[im 12/47]
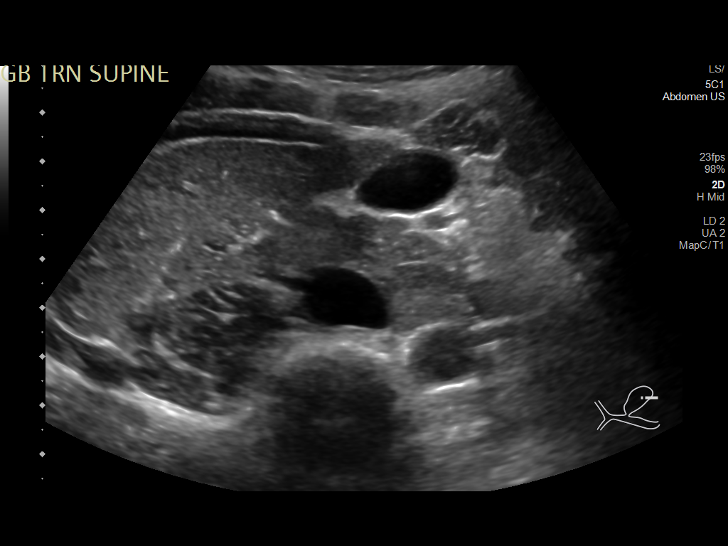
[im 16/47]
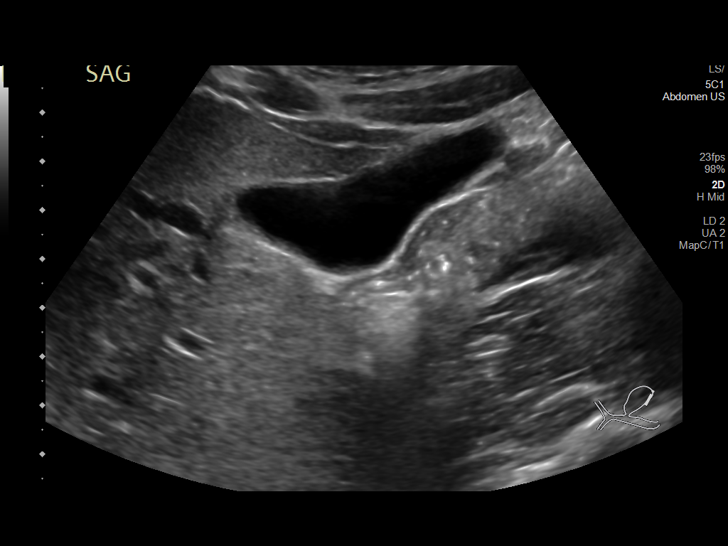
[im 18/47]
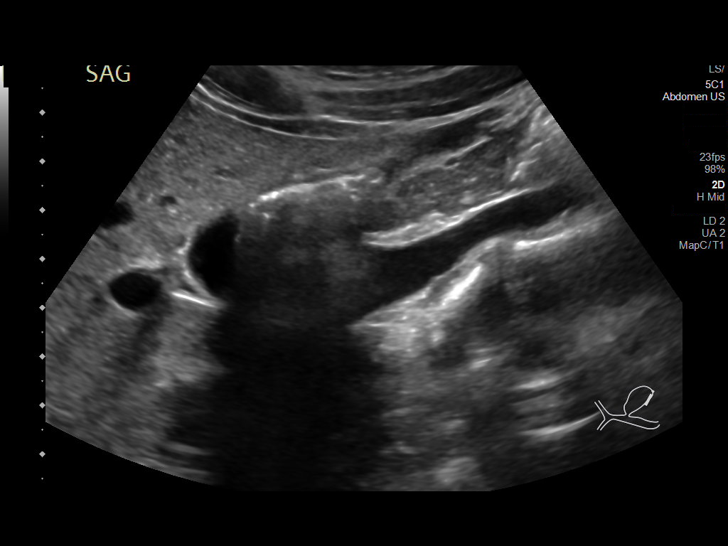
[im 22/47]
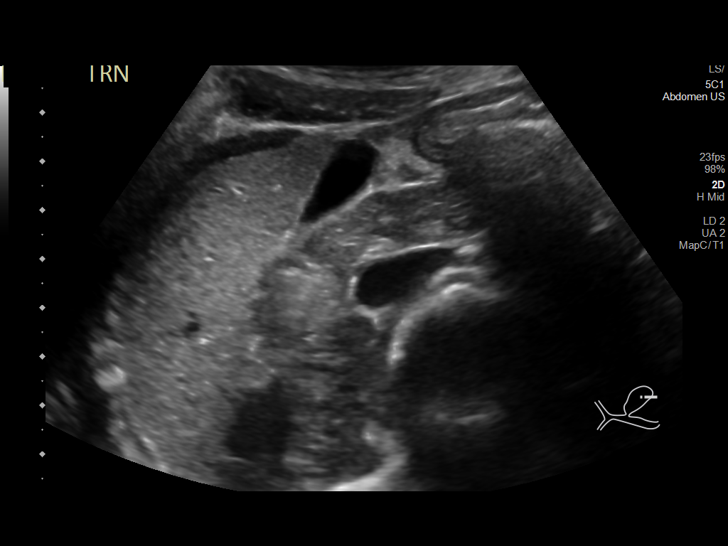
[im 25/47]
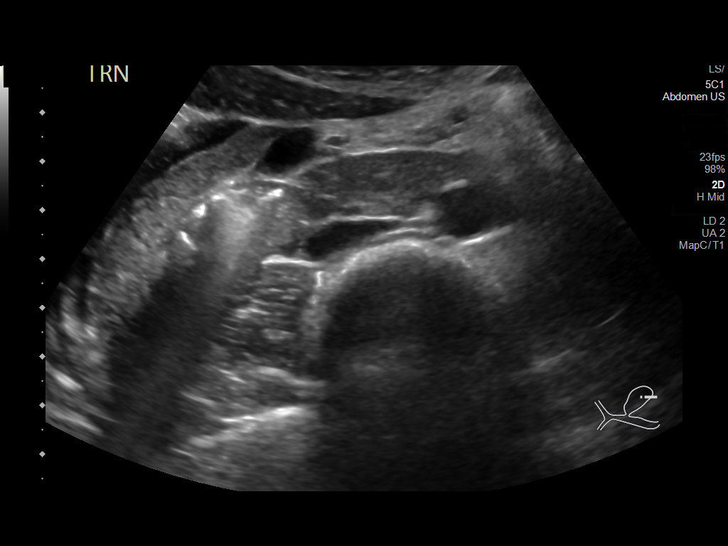
[im 29/47]
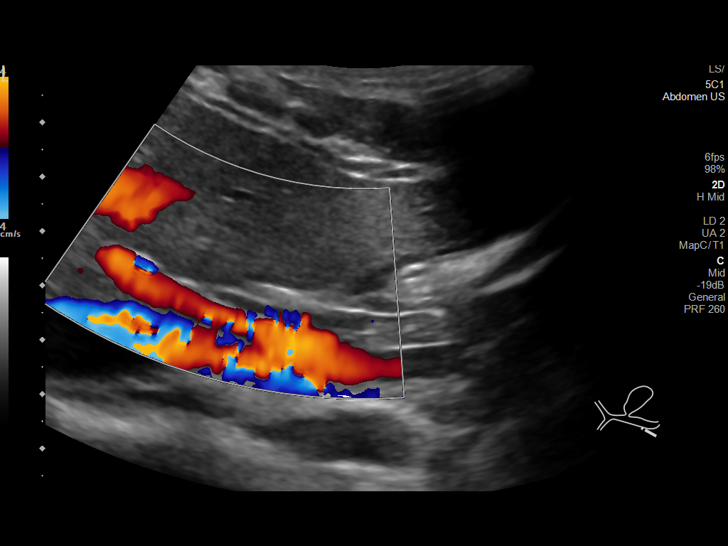
[im 31/47]
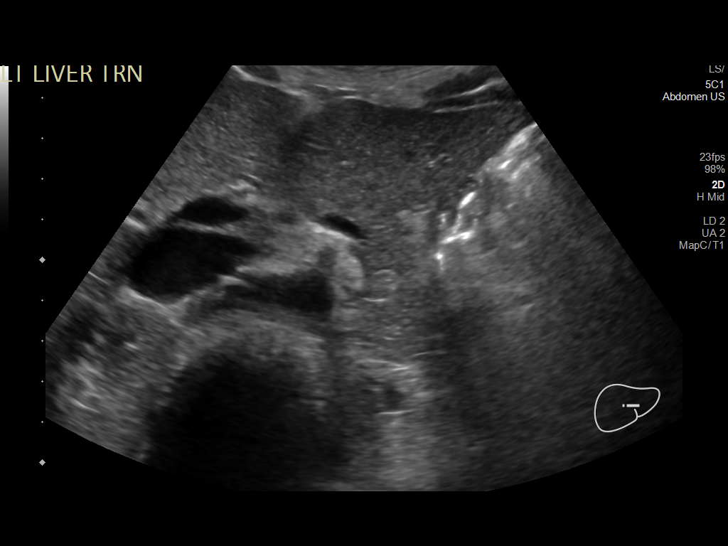
[im 35/47]
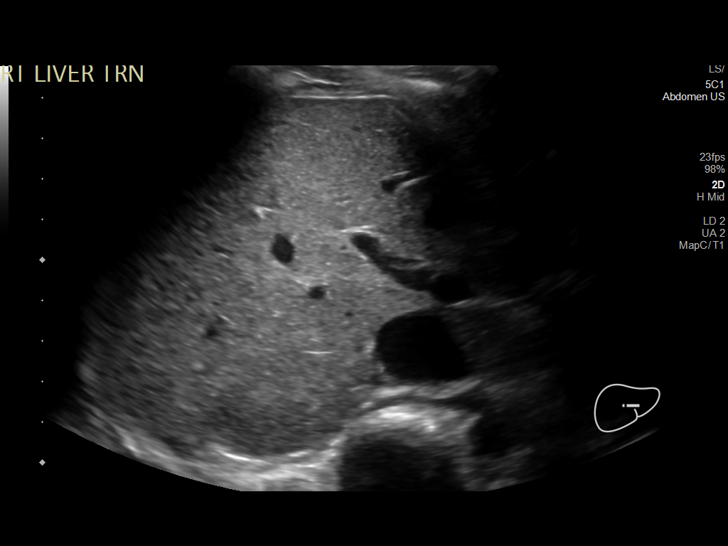
[im 39/47]
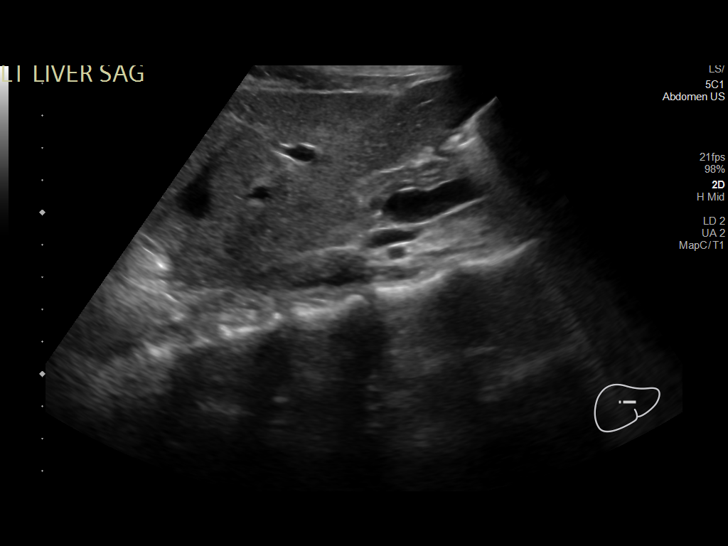
[im 43/47]
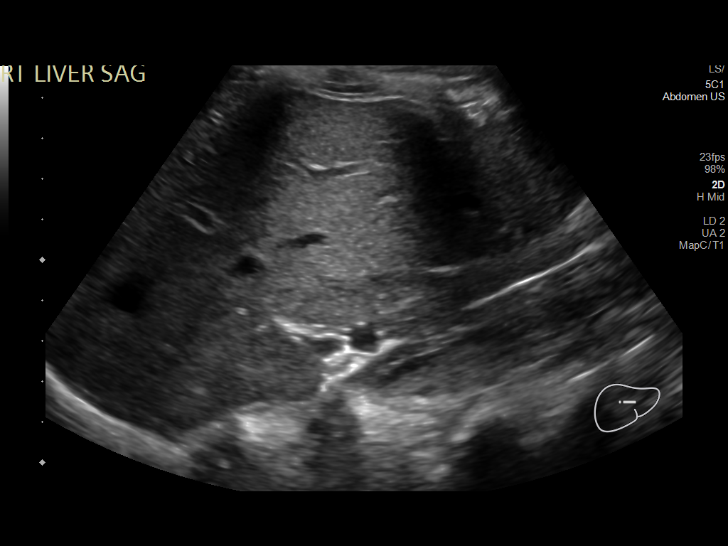
[im 47/47]
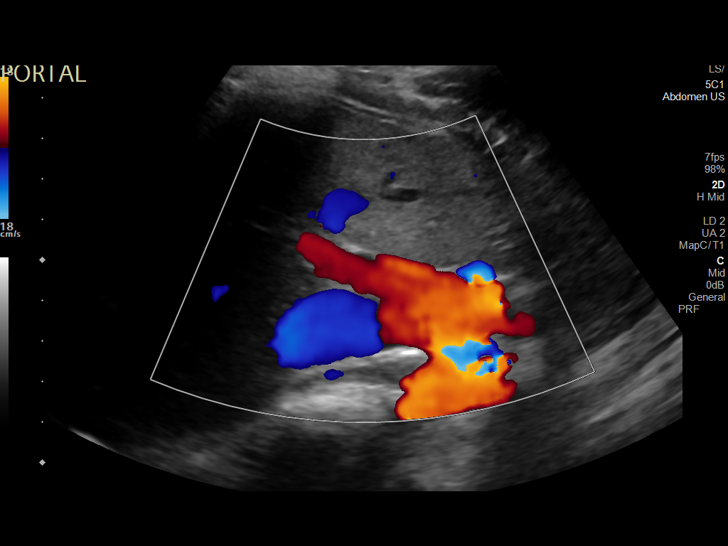

[14 of 25 positions shown; findings below may reference images not displayed]

FINDINGS: Gallbladder:

No gallstones or wall thickening visualized. No sonographic Murphy
sign noted by sonographer.

Common bile duct:

Diameter: 1.9 mm

Liver:

No focal lesion identified. Within normal limits in parenchymal
echogenicity. Portal vein is patent on color Doppler imaging with
normal direction of blood flow towards the liver.

Other: None.
IMPRESSION: 1. Normal right upper quadrant ultrasound.

## 2024-05-21 ENCOUNTER — Other Ambulatory Visit: Payer: Self-pay

## 2024-05-21 ENCOUNTER — Emergency Department (HOSPITAL_BASED_OUTPATIENT_CLINIC_OR_DEPARTMENT_OTHER)
Admission: EM | Admit: 2024-05-21 | Discharge: 2024-05-21 | Disposition: A | Discharge status: Home / Self Care | Attending: Emergency Medicine | Admitting: Emergency Medicine

## 2024-05-21 ENCOUNTER — Encounter (HOSPITAL_BASED_OUTPATIENT_CLINIC_OR_DEPARTMENT_OTHER): Payer: Self-pay

## 2024-05-21 DIAGNOSIS — L0291 Cutaneous abscess, unspecified: Secondary | ICD-10-CM

## 2024-05-21 DIAGNOSIS — F17290 Nicotine dependence, other tobacco product, uncomplicated: Secondary | ICD-10-CM | POA: Insufficient documentation

## 2024-05-21 DIAGNOSIS — M79645 Pain in left finger(s): Secondary | ICD-10-CM | POA: Diagnosis not present

## 2024-05-21 DIAGNOSIS — L02512 Cutaneous abscess of left hand: Secondary | ICD-10-CM | POA: Diagnosis not present

## 2024-05-21 MED ORDER — LIDOCAINE HCL (PF) 1 % IJ SOLN
5.0000 mL | Freq: Once | INTRAMUSCULAR | Status: AC
  Administered 2024-05-21: 5 mL
  Filled 2024-05-21: qty 5

## 2024-05-21 MED ORDER — DOXYCYCLINE HYCLATE 100 MG PO CAPS: 100.0000 mg | ORAL_CAPSULE | Freq: Two times a day (BID) | ORAL | 0 refills | Status: DC

## 2024-05-21 NOTE — ED Provider Notes (Signed)
 [place] EMERGENCY DEPARTMENT AT [facility] Provider Note   CSN: [id number] Arrival date & time: 05/21/24  4098     Patient presents with: Hand Pain   Alexis Gill is a 28 y.o. female.    Hand Pain   28 year old female presents emergency department complaints of left thumb pain.  States that she noticed bump on thumb a week or so ago.  Has had pain in the thumb for the past 2 weeks.  Denies any known trauma/injuries to affected thumb.  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  Concerned about infection.  Denies any fevers, chills, pain/trauma elsewhere.  States that her last menstrual cycle ended last week and is not concerned about pregnancy.  Has not been on antibiotics recently.  No significant pertinent past medical history.  Prior to Admission medications   Medication Sig Start Date End Date Taking? Authorizing Provider  clindamycin  (CLEOCIN ) 150 MG capsule Take 2 capsules (300 mg total) by mouth 3 (three) times daily. May dispense as 150mg  capsules 11/05/22   Coretha Dew, PA-C  naproxen  (NAPROSYN ) 500 MG tablet Take 1 tablet (500 mg total) by mouth 2 (two) times daily. 11/05/22   Coretha Dew, PA-C    Allergies: Penicillins and Amoxicillin    Review of Systems  All other systems reviewed and are negative.   Updated Vital Signs BP (!) 114/93   Pulse 85   Temp 98.4 F (36.9 C) (Oral)   Resp 16   Ht 5' 1 (1.549 m)   Wt 39 kg   LMP 05/08/2024 (Approximate)   SpO2 98%   BMI 16.25 kg/m   Physical Exam Vitals and nursing note reviewed.  Constitutional:      General: She is not in acute distress.    Appearance: She is well-developed.  HENT:     Head: Normocephalic and atraumatic.   Eyes:     Conjunctiva/sclera: Conjunctivae normal.    Cardiovascular:     Rate and Rhythm: Normal rate and regular rhythm.     Heart sounds: No murmur heard. Pulmonary:     Effort: Pulmonary effort is normal. No  respiratory distress.     Breath sounds: Normal breath sounds.  Abdominal:     Palpations: Abdomen is soft.     Tenderness: There is no abdominal tenderness.   Musculoskeletal:        General: No swelling.     Cervical back: Neck supple.     Comments: Full range of motion of left thumb.  Area of erythema appreciated palmar aspect distal third of distal phalanx of thumb with small area of palpable fluctuance measuring 3 mm or so in size in diameter.  Bedside ultrasound showed no felon present both superficial fluid collection.  No tenderness, erythema or swelling on the dorsal aspect of thumb or along nailbed..   Skin:    General: Skin is warm and dry.     Capillary Refill: Capillary refill takes less than 2 seconds.   Neurological:     Mental Status: She is alert.   Psychiatric:        Mood and Affect: Mood normal.    (all labs ordered are listed, but only abnormal results are displayed) Labs Reviewed - No data to display  EKG: None  Radiology: No results found.   .Incision and Drainage  Date/Time: 05/21/2024 10:10 AM  Performed by: Troup Butter, PA Authorized by: Carrollton Butter, PA  Consent:    Consent obtained:  Verbal   Consent given by:  Patient   Risks discussed:  Bleeding, incomplete drainage, pain and damage to other organs   Alternatives discussed:  No treatment Universal protocol:    Procedure explained and questions answered to patient or proxy's satisfaction: yes     Relevant documents present and verified: yes     Test results available : yes     Patient identity confirmed:  Verbally with patient Location:    Type:  Abscess   Size:  8mm   Location:  Upper extremity   Upper extremity location:  Finger   Finger location:  L thumb Pre-procedure details:    Skin preparation:  Chlorhexidine with alcohol Anesthesia:    Anesthesia method:  Local infiltration   Local anesthetic:  Lidocaine  1% w/o epi Procedure type:    Complexity:   Simple Procedure details:    Incision types:  Stab incision (18-gauge needle)   Incision depth:  Subcutaneous   Drainage:  Purulent   Drainage amount:  Scant   Wound treatment:  Wound left open   Packing materials:  None Post-procedure details:    Procedure completion:  Tolerated well, no immediate complications    Medications Ordered in the ED  lidocaine  (PF) (XYLOCAINE ) 1 % injection 5 mL (has no administration in time range)                                    Medical Decision Making Risk Prescription drug management.   This patient presents to the ED for concern of abscess, this involves an extensive number of treatment options, and is a complaint that carries with it a high risk of complications and morbidity.  The differential diagnosis includes abscess, cellulitis, erysipelas, necrotizing infection, paronychia, felon, other   Co morbidities that complicate the patient evaluation  See HPI   Additional history obtained:  Additional history obtained from EMR External records from outside source obtained and reviewed including hospital records   Lab Tests:  N/a   Imaging Studies ordered:  N/a   Cardiac Monitoring: / EKG:  N/a   Consultations Obtained:  N/a   Problem List / ED Course / Critical interventions / Medication management  Abscess I ordered medication including lidocaine    Reevaluation of the patient after these medicines showed that the patient improved I have reviewed the patients home medicines and have made adjustments as needed   Social Determinants of Health:  Chronic e-cigarette use.  Denies illicit drug use.   Test / Admission - Considered:  Abscess Vitals signs within normal range and stable throughout visit. 28 year old female presents emergency department complaints of left thumb pain.  States that she noticed bump on thumb a week or so ago.  Has had pain in the thumb for the past 2 weeks.  Denies any known  trauma/injuries to affected thumb.  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  Concerned about infection.  Denies any fevers, chills, pain/trauma elsewhere.  States that her last menstrual cycle ended last week and is not concerned about pregnancy.  Has not been on antibiotics recently. On exam, area of erythema appreciated on the palmar aspect distal third of left thumb with area of palpable fluctuance present.  Bedside ultrasound performed with official fluid collection consistent with abscess without evidence of deeper felon.  Area anesthetized, drained in  manner as above.  Will place patient on antibiotics given concern for cellulitic skin changes surrounding.  Will recommend local wound care at home.  Recommend follow-up with PCP in the outpatient setting for reassessment.  Treatment plan discussed with patient and she acknowledged her stated was agreeable to said plan.  Patient overall well-appearing, afebrile in no acute distress. Worrisome signs and symptoms were discussed with the patient, and the patient acknowledged understanding to return to the ED if noticed. Patient was stable upon discharge.       Final diagnoses:  None    ED Discharge Orders     None          Martin Butter, [place] 05/21/24 1143    Arvilla Birmingham, MD 05/21/24 [phone]

## 2024-05-21 NOTE — Discharge Instructions (Addendum)
 As discussed, your abscess was drained while in the emergency department.  Recommend warm compresses at home to help aid in drainage.  Will put on antibiotics to treat infection.  You may take Tylenol , ibuprofen  for pain.  Please not hesitate to return to emergency department if the worrisome signs and symptoms we discussed become apparent.

## 2024-05-21 NOTE — ED Triage Notes (Signed)
 Patient states bump on left thumb that she noticed a week ago. Pt states she is concerned for a staff infection that she believes she may have caught from a relative.

## 2024-06-25 ENCOUNTER — Inpatient Hospital Stay (HOSPITAL_COMMUNITY)
Admission: AD | Admit: 2024-06-25 | Discharge: 2024-06-25 | Disposition: A | Discharge status: Home / Self Care | Attending: Family Medicine | Admitting: Family Medicine

## 2024-06-25 ENCOUNTER — Encounter (HOSPITAL_COMMUNITY): Payer: Self-pay

## 2024-06-25 ENCOUNTER — Inpatient Hospital Stay (HOSPITAL_COMMUNITY)

## 2024-06-25 ENCOUNTER — Other Ambulatory Visit: Payer: Self-pay

## 2024-06-25 DIAGNOSIS — D251 Intramural leiomyoma of uterus: Secondary | ICD-10-CM | POA: Diagnosis not present

## 2024-06-25 DIAGNOSIS — Z3A01 Less than 8 weeks gestation of pregnancy: Secondary | ICD-10-CM

## 2024-06-25 DIAGNOSIS — O212 Late vomiting of pregnancy: Secondary | ICD-10-CM | POA: Diagnosis not present

## 2024-06-25 DIAGNOSIS — O3481 Maternal care for other abnormalities of pelvic organs, first trimester: Secondary | ICD-10-CM | POA: Diagnosis not present

## 2024-06-25 DIAGNOSIS — Z3A22 22 weeks gestation of pregnancy: Secondary | ICD-10-CM | POA: Diagnosis not present

## 2024-06-25 DIAGNOSIS — O3411 Maternal care for benign tumor of corpus uteri, first trimester: Secondary | ICD-10-CM | POA: Diagnosis not present

## 2024-06-25 DIAGNOSIS — O219 Vomiting of pregnancy, unspecified: Secondary | ICD-10-CM

## 2024-06-25 DIAGNOSIS — O26891 Other specified pregnancy related conditions, first trimester: Secondary | ICD-10-CM | POA: Diagnosis not present

## 2024-06-25 DIAGNOSIS — O208 Other hemorrhage in early pregnancy: Secondary | ICD-10-CM | POA: Diagnosis not present

## 2024-06-25 DIAGNOSIS — R1010 Upper abdominal pain, unspecified: Secondary | ICD-10-CM | POA: Diagnosis present

## 2024-06-25 DIAGNOSIS — Z3490 Encounter for supervision of normal pregnancy, unspecified, unspecified trimester: Secondary | ICD-10-CM

## 2024-06-25 DIAGNOSIS — R110 Nausea: Secondary | ICD-10-CM | POA: Diagnosis present

## 2024-06-25 HISTORY — DX: Calculus of kidney: N20.0

## 2024-06-25 HISTORY — DX: Urinary tract infection, site not specified: N39.0

## 2024-06-25 LAB — URINALYSIS, ROUTINE W REFLEX MICROSCOPIC
Bilirubin Urine: NEGATIVE
Glucose, UA: NEGATIVE mg/dL
Hgb urine dipstick: NEGATIVE
Ketones, ur: NEGATIVE mg/dL
Nitrite: NEGATIVE
Protein, ur: NEGATIVE mg/dL
Specific Gravity, Urine: 1.024 (ref 1.005–1.030)
pH: 6 (ref 5.0–8.0)

## 2024-06-25 LAB — COMPREHENSIVE METABOLIC PANEL WITH GFR
ALT: 17 U/L (ref 0–44)
AST: 18 U/L (ref 15–41)
Albumin: 3.7 g/dL (ref 3.5–5.0)
Alkaline Phosphatase: 30 U/L — ABNORMAL LOW (ref 38–126)
Anion gap: 7 (ref 5–15)
BUN: 5 mg/dL — ABNORMAL LOW (ref 6–20)
CO2: 24 mmol/L (ref 22–32)
Calcium: 9.7 mg/dL (ref 8.9–10.3)
Chloride: 103 mmol/L (ref 98–111)
Creatinine, Ser: 0.65 mg/dL (ref 0.44–1.00)
GFR, Estimated: >60 mL/min (ref >60)
Glucose, Bld: 81 mg/dL (ref 70–99)
Potassium: 3.4 mmol/L — ABNORMAL LOW (ref 3.5–5.1)
Sodium: 134 mmol/L — ABNORMAL LOW (ref 135–145)
Total Bilirubin: 0.6 mg/dL (ref 0.0–1.2)
Total Protein: 6.4 g/dL — ABNORMAL LOW (ref 6.5–8.1)

## 2024-06-25 LAB — CBC
HCT: 37 % (ref 36.0–46.0)
Hemoglobin: 12.7 g/dL (ref 12.0–15.0)
MCH: 32.9 pg (ref 26.0–34.0)
MCHC: 34.3 g/dL (ref 30.0–36.0)
MCV: 95.9 fL (ref 80.0–100.0)
Platelets: 178 K/uL (ref 150–400)
RBC: 3.86 MIL/uL — ABNORMAL LOW (ref 3.87–5.11)
RDW: 12.8 % (ref 11.5–15.5)
WBC: 7.9 K/uL (ref 4.0–10.5)
nRBC: 0 % (ref 0.0–0.2)

## 2024-06-25 LAB — HCG, QUANTITATIVE, PREGNANCY: hCG, Beta Chain, Quant, S: 95658 m[IU]/mL — ABNORMAL HIGH (ref <5)

## 2024-06-25 MED ORDER — METOCLOPRAMIDE HCL 10 MG PO TABS: 10.0000 mg | ORAL_TABLET | Freq: Four times a day (QID) | ORAL | 2 refills | Status: AC

## 2024-06-25 NOTE — MAU Provider Note (Signed)
 None     S [name] is a 28 y.o. G2P0010 pregnant female at [redacted]w[redacted]d who presents to MAU today with complaint of intermittent abdominal pain. SABRA   Has not yet established prenatal care.   Pertinent items noted in HPI and remainder of comprehensive ROS otherwise negative.   O BP 102/61 (BP Location: Right Arm)   Pulse 65   Temp 98.8 F (37.1 C) (Oral)   Resp 16   Ht 5' 1 (1.549 m)   Wt 40.4 kg   LMP 05/08/2024 (Approximate)   SpO2 100%   BMI 16.84 kg/m  Physical Exam Vitals reviewed.  Constitutional:      Appearance: Normal appearance.  HENT:     Head: Normocephalic.  Cardiovascular:     Rate and Rhythm: Normal rate and regular rhythm.     Pulses: Normal pulses.     Heart sounds: Normal heart sounds.  Pulmonary:     Effort: Pulmonary effort is normal.     Breath sounds: Normal breath sounds.  Abdominal:     General: Abdomen is flat.  Skin:    General: Skin is warm and dry.     Capillary Refill: Capillary refill takes less than 2 seconds.  Neurological:     General: No focal deficit present.     Mental Status: She is alert and oriented to person, place, and time.  Psychiatric:        Mood and Affect: Mood normal.        Behavior: Behavior normal.      MDM: Reviewed history and EMR PERU workup with IUP identified.   MAU Course:  A Intrauterine pregnancy - Plan: Discharge patient  Nausea and vomiting in pregnancy prior to [redacted] weeks gestation  Medical screening exam complete  P Discharge from MAU in stable condition with routine precautions Follow up at preferred [facility] provider to establish prenatal care, list provided. Safe med list provided Reglan 10mg  TID PRN sent to manage nausea.   Allergies as of 06/25/2024       Reactions   Penicillins Anaphylaxis   Amoxicillin Swelling        Medication List     STOP taking these medications    clindamycin  150 MG capsule Commonly known as: CLEOCIN    doxycycline  100 MG capsule Commonly known as:  VIBRAMYCIN    naproxen  500 MG tablet Commonly known as: NAPROSYN        TAKE these medications    metoCLOPramide 10 MG tablet Commonly known as: REGLAN Take 1 tablet (10 mg total) by mouth every 6 (six) hours.        Camie Rote, MSN, CNM 06/25/2024 7:41 PM  Certified Nurse Midwife, [facility]

## 2024-06-25 NOTE — ED Provider Triage Note (Addendum)
 Emergency Medicine Provider Triage Evaluation Note  Alexis Gill , a 28 y.o. female  was evaluated in triage.  Pt complains of upper abd pain started last night, notes associated N, no DV. No fever. No vaginal bleeding or vaginal discharge.   Review of Systems  Positive: Abdominal pain.  Negative: Fever  Physical Exam  BP 104/81 (BP Location: Right Arm)   Pulse 75   Temp 98.2 F (36.8 C)   Resp 16   Ht 5' 1 (1.549 m)   Wt 39.5 kg   LMP 05/08/2024 (Approximate)   SpO2 100%   BMI 16.44 kg/m  Gen:   Awake, no distress   Resp:  Normal effort  MSK:   Moves extremities without difficulty  Other:    Medical Decision Making  Medically screening exam initiated at 1:41 PM.  Appropriate orders placed.  Alexis Gill was informed that the remainder of the evaluation will be completed by another provider, this initial triage assessment does not replace that evaluation, and the importance of remaining in the ED until their evaluation is complete.   Called Alexis Gill, Alexis Gill will see her, patient will go to Alexis Gill.    Alexis Warren SAILOR, PA-C 06/25/24 1347

## 2024-06-25 NOTE — ED Triage Notes (Addendum)
 Pt to ED c/o abdominal pain x 1 days, denies vaginal bleeding/ vaginal discharge. Reports [redacted] weeks pregnant, establishing pregnancy care next week

## 2024-06-25 NOTE — Discharge Instructions (Addendum)

## 2024-06-25 NOTE — MAU Note (Addendum)
 Alexis Gill is a 28 y.o. at [redacted]w[redacted]d here in MAU reporting: sent up from ED for further eval. Pain is not typical cramping.  Pain in upper abd, rt to left side, like pins and needles. No bleeding. Currently not in pain, stopped while in waiting rm.  Onset of complaint: this morning after work (sleeps in a recliner at work) Pain score: none currently Vitals:   06/25/24 1138 06/25/24 1407  BP: 104/81 102/61  Pulse: 75 65  Resp: 16 16  Temp: 98.2 F (36.8 C) 98.8 F (37.1 C)  SpO2: 100% 100%     Lab orders placed from triage:  sent up for ectopic work up

## 2024-06-26 ENCOUNTER — Inpatient Hospital Stay (HOSPITAL_COMMUNITY)
Admission: AD | Admit: 2024-06-26 | Discharge: 2024-06-26 | Disposition: A | Discharge status: Home / Self Care | Payer: Self-pay | Attending: Obstetrics and Gynecology | Admitting: Obstetrics and Gynecology

## 2024-06-26 DIAGNOSIS — O208 Other hemorrhage in early pregnancy: Secondary | ICD-10-CM

## 2024-06-26 DIAGNOSIS — D251 Intramural leiomyoma of uterus: Secondary | ICD-10-CM | POA: Insufficient documentation

## 2024-06-26 DIAGNOSIS — Z3A01 Less than 8 weeks gestation of pregnancy: Secondary | ICD-10-CM | POA: Diagnosis not present

## 2024-06-26 DIAGNOSIS — Z3491 Encounter for supervision of normal pregnancy, unspecified, first trimester: Secondary | ICD-10-CM

## 2024-06-26 DIAGNOSIS — O3411 Maternal care for benign tumor of corpus uteri, first trimester: Secondary | ICD-10-CM | POA: Insufficient documentation

## 2024-06-26 NOTE — MAU Note (Signed)
 Alexis Gill is a 28 y.o. at [redacted]w[redacted]d here in MAU reporting: felt wetness and went to the restroom and there was blood in her underwear. ? Light/moderate. No clots.  Had not had bleeding previously. (Small subchorionic Hem noted on US  yesterday).  No recent intercourse. Is cramping in lower abd now.  Pt scared and tearful.   Onset of complaint: 0945 Pain score: mild Vitals:   06/26/24 1021  BP: 99/74  Pulse: 83  Resp: 17  Temp: 99 F (37.2 C)  SpO2: 100%      Lab orders placed from triage:

## 2024-06-26 NOTE — MAU Provider Note (Signed)
 History     CSN: [id number]  Arrival date and time: 06/26/24 9046   Chief Complaint  Patient presents with   Vaginal Bleeding   Abdominal Pain   HPI  Alexis Gill is a 28 y.o. G2P0010 at [redacted]w[redacted]d who presents for evaluation of vaginal bleeding. Patient reports she took her reglan  for nausea this morning and 15 minutes later started seeing bleeding when she wiped. She denies any pain. She was seen in MAU yesterday and confirmed IUP at that time.   OB History     Gravida  2   Para      Term      Preterm      AB  1   Living         SAB  1   IAB      Ectopic      Multiple      Live Births              Past Medical History:  Diagnosis Date   Kidney stone    Medical history non-contributory    UTI (urinary tract infection)     Past Surgical History:  Procedure Laterality Date   NO PAST SURGERIES      Family History  Problem Relation Age of Onset   Healthy Mother    Healthy Father     Social History   Tobacco Use   Smoking status: Former    Types: E-cigarettes   Smokeless tobacco: Never   Tobacco comments:    Stopped when found out preg  Vaping Use   Vaping status: Former  Substance Use Topics   Alcohol use: No   Drug use: Not Currently    Types: Marijuana    Comment: stopped with + preg    Allergies:  Allergies  Allergen Reactions   Penicillins Anaphylaxis   Amoxicillin Swelling    Medications Prior to Admission  Medication Sig Dispense Refill Last Dose/Taking   metoCLOPramide  (REGLAN ) 10 MG tablet Take 1 tablet (10 mg total) by mouth every 6 (six) hours. 120 tablet 2 06/26/2024 Morning    Review of Systems  Constitutional: Negative.  Negative for fatigue and fever.  HENT: Negative.    Respiratory: Negative.  Negative for shortness of breath.   Cardiovascular: Negative.  Negative for chest pain.  Gastrointestinal: Negative.  Negative for abdominal pain, constipation, diarrhea, nausea and vomiting.  Genitourinary: Negative.   Negative for dysuria.  Neurological: Negative.  Negative for dizziness and headaches.   Physical Exam   Blood pressure 99/74, pulse 83, temperature 99 F (37.2 C), temperature source Oral, resp. rate 17, height 5' 1 (1.549 m), weight 40.4 kg, last menstrual period 05/08/2024, SpO2 100%, unknown if currently breastfeeding.  Patient Vitals for the past 24 hrs:  BP Temp Temp src Pulse Resp SpO2 Height Weight  06/26/24 1021 99/74 99 F (37.2 C) Oral 83 17 100 % 5' 1 (1.549 m) 40.4 kg    Physical Exam Vitals and nursing note reviewed.  Constitutional:      General: She is not in acute distress.    Appearance: She is well-developed.  HENT:     Head: Normocephalic.  Eyes:     Pupils: Pupils are equal, round, and reactive to light.  Cardiovascular:     Rate and Rhythm: Normal rate and regular rhythm.     Heart sounds: Normal heart sounds.  Pulmonary:     Effort: Pulmonary effort is normal. No respiratory distress.     Breath  sounds: Normal breath sounds.  Abdominal:     General: Bowel sounds are normal. There is no distension.     Palpations: Abdomen is soft.     Tenderness: There is no abdominal tenderness.  Skin:    General: Skin is warm and dry.  Neurological:     Mental Status: She is alert and oriented to person, place, and time.  Psychiatric:        Mood and Affect: Mood normal.        Behavior: Behavior normal.        Thought Content: Thought content normal.        Judgment: Judgment normal.    Pt informed that the ultrasound is considered a limited OB ultrasound and is not intended to be a complete ultrasound exam.  Patient also informed that the ultrasound is not being completed with the intent of assessing for fetal or placental anomalies or any pelvic abnormalities.  Explained that the purpose of today's ultrasound is to assess for  viability.  Patient acknowledges the purpose of the exam and the limitations of the study.    IUP with FHR 144 bpm  MAU Course   Procedures  US  OB LESS THAN 14 WEEKS WITH OB TRANSVAGINAL Result Date: 06/25/2024 CLINICAL DATA:  Abdominal pain during pregnancy EXAM: OBSTETRIC <14 WK US  AND TRANSVAGINAL OB US  TECHNIQUE: Both transabdominal and transvaginal ultrasound examinations were performed for complete evaluation of the gestation as well as the maternal uterus, adnexal regions, and pelvic cul-de-sac. Transvaginal technique was performed to assess early pregnancy. COMPARISON:  None Available. FINDINGS: Intrauterine gestational sac: Single Yolk sac:  Visualized. Embryo:  Visualized. Cardiac Activity: Visualized. Heart Rate: 114 bpm CRL:  8.2 mm   6 w   5 d                  US  EDC: 02/13/2025 Subchorionic hemorrhage: There is a small subchorionic hemorrhage visualized inferior to the gestational sac. Maternal uterus/adnexae: Complex cystic area in the right ovary measures 2.4 cm, possibly corpus luteum. Left ovary is within normal limits. Other: There is trace free fluid in the pelvis. Intramural fibroid is identified on the left measuring 1.6 x 1.1 x 1.2 cm. IMPRESSION: 1. Single live intrauterine gestation measuring 6 weeks and 5 days. 2. Small subchorionic hemorrhage inferior to the gestational sac. 3. Trace free fluid in the pelvis. 4. Small intramural uterine fibroid. 5. Likely corpus luteum in the right ovary. Electronically Signed   By: Greig Pique M.D.   On: 06/25/2024 18:09     MDM Reviewed results of yesterday's u/s with patient and partner that showed [facility].  Reviewed results of subchorionic hemorrhage with patient and partner. Discussed that this is a common finding in the first trimester and does not usually cause problems in the pregnancy like loss or difficulty with development. Reviewed expectations for vaginal bleeding including a small amount possibly for several weeks. Reviewed warning signs of heavy bleeding, saturating a pad in less than an hour, and severe pain as reasons to come back to MAU. Encouraged patient  to exercise pelvic rest until 7 days after bleeding stops. Patient and support person verbalized understanding.   FHR present on exam today as well.   Assessment and Plan   1. Subchorionic hemorrhage in first trimester   2. [redacted] weeks gestation of pregnancy   3. Normal intrauterine pregnancy on prenatal ultrasound in first trimester     -Discharge home in stable condition -Bleeding precautions discussed -Patient advised to  follow-up with OB as scheduled for prenatal care -Patient may return to MAU as needed or if her condition were to change or worsen  Aleck CHRISTELLA Fireman, CNM 06/26/2024, 10:26 AM

## 2024-06-28 LAB — GC/CHLAMYDIA PROBE AMP (~~LOC~~) NOT AT ARMC
Chlamydia: NEGATIVE
Comment: NEGATIVE
Comment: NORMAL
Neisseria Gonorrhea: NEGATIVE

## 2024-08-09 ENCOUNTER — Other Ambulatory Visit (INDEPENDENT_AMBULATORY_CARE_PROVIDER_SITE_OTHER): Payer: Self-pay

## 2024-08-09 ENCOUNTER — Ambulatory Visit (INDEPENDENT_AMBULATORY_CARE_PROVIDER_SITE_OTHER): Admitting: *Deleted

## 2024-08-09 VITALS — BP 95/61 | HR 73 | Wt 96.0 lb

## 2024-08-09 DIAGNOSIS — Z3481 Encounter for supervision of other normal pregnancy, first trimester: Secondary | ICD-10-CM

## 2024-08-09 DIAGNOSIS — Z3480 Encounter for supervision of other normal pregnancy, unspecified trimester: Secondary | ICD-10-CM | POA: Insufficient documentation

## 2024-08-09 DIAGNOSIS — Z3A13 13 weeks gestation of pregnancy: Secondary | ICD-10-CM | POA: Diagnosis not present

## 2024-08-09 DIAGNOSIS — Z362 Encounter for other antenatal screening follow-up: Secondary | ICD-10-CM

## 2024-08-09 MED ORDER — PRENATE PIXIE 10-0.6-0.4-200 MG PO CAPS: 1.0000 | ORAL_CAPSULE | Freq: Every day | ORAL | 11 refills | Status: DC

## 2024-08-09 MED ORDER — BLOOD PRESSURE KIT DEVI: 1.0000 | 0 refills | Status: AC

## 2024-08-09 NOTE — Patient Instructions (Signed)
 The [facility] practice his participating in a study that provides no-cost doula care. ACURE4Moms is a study looking at how doula care can reduce birthing disparities for Black and brown birthing people. We like to refer patients as soon as possible, but definitely before 28 weeks so patients can get to know their doula.    A doula is trained to provide support before, during and just after you give birth. While doulas do not provide medical care, they do provide emotional, physical and educational support. Doulas can help reduce your stress and comfort you and your partner. They can help you cope with labor by helping you use breathing techniques, massage, creative labor positioning, essential oils and affirmations.   ACURE4Moms is a research study trying to reduce:   low birthweight babies  emergency department visits & hospitalizations for birthing persons and their babies  depression among birthing people  discrimination in pregnancy-related care ACURE4Moms is trying out 2 programs designed by  people who have given birth. These programs include: 1. Sharing patient data and warning alerts with clinic staff to keep them accountable for their patients' outcomes and providing tools to help them  reduce bias in care. 2. Matching eligible patients with doulas from the  same community as the patients.  If you would like to participate in this study, please visit:   [email or web address] has a partnership with the [employer] to provide prenatal navigation for the most needed resources in our community. In order to see how we can help connect you to these resources we need consent to contact you. Please complete the very short consent using the link below:   English Link: https://[place].tfaforms.net/283?site=16  Spanish Link: https://[place].tfaforms.net/287?site=16  Options for Doula Care in the [place] Area  As you review your  birthing options, consider having a birth doula. A doula is trained to provide support before, during and just after you give birth. There are also postpartum doulas that help you adjust to new parenthood.  While doulas do not provide medical care, they do provide emotional, physical and educational support. A few months before your baby arrives, doulas can help answer questions, ease concerns and help you create and support your birthing plan.    Doulas can help reduce your stress and comfort you and your partner. They can help you cope with labor by helping you use breathing techniques, massage, creative labor positioning, essential oils and affirmations.   Studies show that the benefits of having a doula include:   A more positive birth experience  Fewer requests for pain-relief medication  Less likelihood of cesarean section, commonly called a c-section   Doulas are typically hired via a [employer] account planner between you and the doula. We are happy to provide a list of the most active doulas in the area, all of whom are credentialed by [facility] and will not count as a visitor at your birth.  There are several options for no-cost doula care at our hospital, including:  [facility] Volunteer Doula Program Every [employer] Program A Cure 4 Moms Doula Study (available only at [employer] [facility], [place], [place] and [facility] offices)  For more information on these programs or to receive a list of doulas active in our area, please email [email or web address]

## 2024-08-09 NOTE — Progress Notes (Signed)
 New OB Intake  I connected with Alexis Gill  on 08/09/24 at 10:15 AM EDT by In Person Visit and verified that I am speaking with the correct person using two identifiers. Nurse is located at [facility] and pt is located at [facility].  I discussed the limitations, risks, security and privacy concerns of performing an evaluation and management service by telephone and the availability of in person appointments. I also discussed with the patient that there may be a patient responsible charge related to this service. The patient expressed understanding and agreed to proceed.  I explained I am completing New OB Intake today. We discussed EDD of 02/12/25 based on LMP of 05/08/24. Pt is G2P0010. I reviewed her allergies, medications and Medical/Surgical/OB history.    There are no active problems to display for this patient.    Concerns addressed today  Delivery Plans Plans to deliver at [facility]. Discussed the nature of our practice with multiple providers including residents and students as well as female and female providers. Due to the size of the practice, the delivering provider may not be the same as those providing prenatal care.   Patient is not interested in water birth.  MyChart/Babyscripts MyChart access verified. I explained pt will have some visits in office and some virtually. Babyscripts instructions given and order placed. Patient verifies receipt of registration text/e-mail. Account successfully created and app downloaded. If patient is a candidate for Optimized scheduling, add to sticky note.   Blood Pressure Cuff/Weight Scale Blood pressure cuff ordered for patient to pick-up from [employer]. Explained after first prenatal appt pt will check weekly and document in Babyscripts. Patient does not have weight scale; patient may purchase if they desire to track weight weekly in Babyscripts.  Anatomy US  Explained first scheduled US  will be around 19 weeks. Anatomy US  scheduled for TBD  at TBD.  Is patient a candidate for Babyscripts Optimization? No, due to Risk Factors   First visit review I reviewed new OB appt with patient. Explained pt will be seen by Alexis Daring, NP at first visit. Discussed Alexis Gill genetic screening with patient. Requests [facility] and [employer].. Routine prenatal labs collected at today's visit.   Last Pap No results found for: DIAGPAP  Alexis CHRISTELLA Ober, RN 08/09/2024  10:40 AM

## 2024-08-10 LAB — HCV INTERPRETATION

## 2024-08-10 LAB — CBC/D/PLT+RPR+RH+ABO+RUBIGG...
Antibody Screen: NEGATIVE
Basophils Absolute: 0 x10E3/uL (ref 0.0–0.2)
Basos: 0 %
EOS (ABSOLUTE): 0.1 x10E3/uL (ref 0.0–0.4)
Eos: 1 %
HCV Ab: NONREACTIVE
HIV Screen 4th Generation wRfx: NONREACTIVE
Hematocrit: 39.9 % (ref 34.0–46.6)
Hemoglobin: 13.4 g/dL (ref 11.1–15.9)
Hepatitis B Surface Ag: NEGATIVE
Immature Grans (Abs): 0 x10E3/uL (ref 0.0–0.1)
Immature Granulocytes: 0 %
Lymphocytes Absolute: 2.2 x10E3/uL (ref 0.7–3.1)
Lymphs: 28 %
MCH: 33.6 pg — ABNORMAL HIGH (ref 26.6–33.0)
MCHC: 33.6 g/dL (ref 31.5–35.7)
MCV: 100 fL — ABNORMAL HIGH (ref 79–97)
Monocytes Absolute: 0.6 x10E3/uL (ref 0.1–0.9)
Monocytes: 7 %
Neutrophils Absolute: 4.9 x10E3/uL (ref 1.4–7.0)
Neutrophils: 64 %
Platelets: 174 x10E3/uL (ref 150–450)
RBC: 3.99 x10E6/uL (ref 3.77–5.28)
RDW: 12.5 % (ref 11.7–15.4)
RPR Ser Ql: NONREACTIVE
Rh Factor: POSITIVE
Rubella Antibodies, IGG: 1.59 {index} (ref >0.99)
WBC: 7.9 x10E3/uL (ref 3.4–10.8)

## 2024-08-10 LAB — HEMOGLOBIN A1C
Est. average glucose Bld gHb Est-mCnc: 100 mg/dL
Hgb A1c MFr Bld: 5.1 % (ref 4.8–5.6)

## 2024-08-13 ENCOUNTER — Ambulatory Visit: Payer: Self-pay | Admitting: Obstetrics and Gynecology

## 2024-08-13 LAB — CULTURE, OB URINE

## 2024-08-13 LAB — URINE CULTURE, OB REFLEX

## 2024-08-13 MED ORDER — CEPHALEXIN 500 MG PO CAPS: 500.0000 mg | ORAL_CAPSULE | Freq: Four times a day (QID) | ORAL | 2 refills | Status: AC

## 2024-08-14 LAB — PANORAMA PRENATAL TEST FULL PANEL:PANORAMA TEST PLUS 5 ADDITIONAL MICRODELETIONS: FETAL FRACTION: 13.3

## 2024-08-16 LAB — HORIZON CUSTOM: REPORT SUMMARY: NEGATIVE

## 2024-08-18 ENCOUNTER — Encounter: Payer: Self-pay | Admitting: Obstetrics and Gynecology

## 2024-08-18 ENCOUNTER — Other Ambulatory Visit (HOSPITAL_COMMUNITY)
Admission: RE | Admit: 2024-08-18 | Discharge: 2024-08-18 | Disposition: A | Discharge status: Home / Self Care | Source: Ambulatory Visit | Attending: Obstetrics and Gynecology | Admitting: Obstetrics and Gynecology

## 2024-08-18 ENCOUNTER — Ambulatory Visit (INDEPENDENT_AMBULATORY_CARE_PROVIDER_SITE_OTHER): Admitting: Obstetrics and Gynecology

## 2024-08-18 VITALS — BP 87/61 | HR 87 | Wt 94.0 lb

## 2024-08-18 DIAGNOSIS — Z3A14 14 weeks gestation of pregnancy: Secondary | ICD-10-CM | POA: Insufficient documentation

## 2024-08-18 DIAGNOSIS — Z3480 Encounter for supervision of other normal pregnancy, unspecified trimester: Secondary | ICD-10-CM

## 2024-08-18 DIAGNOSIS — O219 Vomiting of pregnancy, unspecified: Secondary | ICD-10-CM

## 2024-08-18 MED ORDER — ASPIRIN 81 MG PO TBEC: 81.0000 mg | DELAYED_RELEASE_TABLET | Freq: Every day | ORAL | 2 refills | Status: AC

## 2024-08-18 NOTE — Patient Instructions (Signed)
   Considering Waterbirth? Guide for patients at [facility]) Why consider waterbirth? Gentle birth for babies  Less pain medicine used in labor  May allow for passive descent/less pushing  May reduce perineal tears  More mobility and instinctive maternal position changes  Increased maternal relaxation   Is waterbirth safe? What are the risks of infection, drowning or other complications? Infection:  Very low risk (3.7 % for tub vs 4.8% for bed)  7 in 8000 waterbirths with documented infection  Poorly cleaned equipment most common cause  Slightly lower group B strep transmission rate  Drowning  Maternal:  Very low risk  Related to seizures or fainting  Newborn:  Very low risk. No evidence of increased risk of respiratory problems in multiple large studies  Physiological protection from breathing under water  Avoid underwater birth if there are any fetal complications  Once baby's head is out of the water, keep it out.  Birth complication  Some reports of cord trauma, but risk decreased by bringing baby to surface gradually  No evidence of increased risk of shoulder dystocia. Mothers can usually change positions faster in water than in a bed, possibly aiding the maneuvers to free the shoulder.   There are 2 things you MUST do to have a waterbirth with [facility]: Attend a waterbirth class at [facility]   3rd Wednesday of every month from 7-9 pm (virtual during COVID) [employer] at [email or web address] or [email or web address] or by calling [phone] Bring us  the certificate from the class to your prenatal appointment or send via MyChart Meet with a midwife at 36 weeks* to see if you can still plan a waterbirth and to sign the consent.   *We also recommend that you schedule as many of your prenatal visits with a midwife as possible.    Helpful information: You may want to bring a bathing suit top to the hospital  to wear during labor but this is optional.  All other supplies are provided by the hospital. Please arrive at the hospital with signs of active labor, and do not wait at home until late in labor. It takes 45 min- 1 hour for fetal monitoring, and check in to your room to take place, plus transport and filling of the waterbirth tub.    Things that would prevent you from having a waterbirth: Premature, <37wks  Previous cesarean birth  Presence of thick meconium-stained fluid  Multiple gestation (Twins, triplets, etc.)  Uncontrolled diabetes or gestational diabetes requiring medication  Hypertension diagnosed in pregnancy or preexisting hypertension (gestational hypertension, preeclampsia, or chronic hypertension) Fetal growth restriction (your baby measures less than 10th percentile on ultrasound) Heavy vaginal bleeding  Non-reassuring fetal heart rate  Active infection (MRSA, etc.). Group B Strep is NOT a contraindication for waterbirth.  If your labor has to be induced and induction method requires continuous monitoring of the baby's heart rate  Other risks/issues identified by your obstetrical provider   Please remember that birth is unpredictable. Under certain unforeseeable circumstances your provider may advise against giving birth in the tub. These decisions will be made on a case-by-case basis and with the safety of you and your baby as our highest priority.    Updated 03/06/22

## 2024-08-18 NOTE — Progress Notes (Signed)
 INITIAL PRENATAL VISIT  Subjective:   Alexis Gill is being seen today for her first obstetrical visit. She is at [redacted]w[redacted]d gestation by LMP. Her obstetrical history is significant for none. Relationship with [name]: involved. Patient does intend to breast feed. Pregnancy history fully reviewed.  Patient reports nausea and vomiting.  Indications for ASA therapy (per uptodate)  Two or more of the following: Nulliparity Yes Obesity (body mass index >30 kg/m2) No Family history of preeclampsia in mother or sister Yes Age >=35 years No Sociodemographic characteristics (African American race, low socioeconomic level) Yes Personal risk factors (eg, previous pregnancy with low birth weight or small for gestational age infant, previous adverse pregnancy outcome [eg, stillbirth], interval >10 years between pregnancies) No    Objective:    Obstetric History OB History  Gravida Para Term Preterm AB Living  2 0 0 0 1 0  SAB IAB Ectopic Multiple Live Births  1 0 0 0 0    # Outcome Date GA Lbr Len/2nd Weight Sex Type Anes PTL Lv  2 Current           1 SAB 07/2021     SAB       Past Medical History:  Diagnosis Date   Kidney stone    Medical history non-contributory    UTI (urinary tract infection)    not frequent    Past Surgical History:  Procedure Laterality Date   NO PAST SURGERIES      Current Outpatient Medications on File Prior to Visit  Medication Sig Dispense Refill   cephALEXin  (KEFLEX ) 500 MG capsule Take 1 capsule (500 mg total) by mouth 4 (four) times daily. 28 capsule 2   Prenat-FeAsp-Meth-FA-DHA w/o A (PRENATE PIXIE ) 10-0.6-0.4-200 MG CAPS Take 1 tablet by mouth daily. 30 capsule 11   Blood Pressure Monitoring (BLOOD PRESSURE KIT) DEVI 1 Device by Does not apply route once a week. (Patient not taking: Reported on 08/18/2024) 1 each 0   metoCLOPramide  (REGLAN ) 10 MG tablet Take 1 tablet (10 mg total) by mouth every 6 (six) hours. (Patient not taking: Reported on  08/18/2024) 120 tablet 2   No current facility-administered medications on file prior to visit.    Allergies  Allergen Reactions   Penicillins Anaphylaxis   Amoxicillin Swelling    Social History:  reports that she has quit smoking. Her smoking use included e-cigarettes. She has never used smokeless tobacco. She reports current drug use. Drug: Marijuana. She reports that she does not drink alcohol.  Family History  Problem Relation Age of Onset   Healthy Mother    Healthy Father     The following portions of the patient's history were reviewed and updated as appropriate: allergies, current medications, past family history, past medical history, past social history, past surgical history and problem list.  Review of Systems Review of Systems  All other systems reviewed and are negative.   Physical Exam:  BP (!) 87/61   Pulse 87   Wt 42.6 kg   LMP 05/08/2024 (Approximate)   BMI 17.76 kg/m  CONSTITUTIONAL: Well-developed, well-nourished female in no acute distress.  HENT:  Normocephalic, atraumatic.   EYES: Conjunctivae normal. NECK: Normal range of motion SKIN: Skin is warm and dry. MUSCULOSKELETAL: Normal range of motion NEUROLOGIC: Alert and oriented  PSYCHIATRIC: Normal mood and affect. Normal behavior.  CARDIOVASCULAR: Normal heart rate noted RESPIRATORY: normal effort ABDOMEN: Soft PELVIC:Pelvic: normal appearing vulva with no masses, tenderness or lesions  VAGINA: normal appearing vagina with normal  color and discharge, no lesions  CERVIX: normal appearing cervix without discharge or lesions, no CMT  Extremities:  No swelling or varicosities noted   Fetal Heart Rate (bpm): 151   Movement: Present       Assessment/Plan:    Pregnancy: G2P0010   Initial labs drawn. Prenatal vitamins. Problem list reviewed and updated. Reviewed in detail the nature of the practice with collaborative care between  Genetic screening discussed: NIPS/First trimester  screen/Quad/AFP results reviewed. Role of ultrasound in pregnancy discussed; Anatomy US : ordered. Follow up in 4 weeks. Weight gain recommendations per IOM guidelines reviewed: underweight/BMI 18.5 or less > 28 - 40 lbs; normal weight/BMI 18.5 - 24.9 > 25 - 35 lbs; overweight/BMI 25 - 29.9 > 15 - 25 lbs; obese/BMI  30 or more > 11 - 20 lbs.  Discussed clinic routines, schedule of care and testing, genetic screening options, involvement of students and residents under the direct supervision of APPs and doctors and presence of female providers. Pt verbalized  understanding.  1. Supervision of other normal pregnancy, antepartum (Primary) -Feeling well today. BP and FHR normal.  -Oriented to practice- discussed waterbirth, childbirth classes, lactation options - Cytology - PAP( [employer]) -TOC for UTI treatment in 4 weeks -  2. [redacted] weeks gestation of pregnancy - Discussed starting ASA 81mg   - Discussed BH resources for anxiety, patient declines at this time -Anatomy US  scheduled for 09/24/24  3. Nausea and vomiting in pregnancy prior to [redacted] weeks gestation -Moderately controlled with diet, declines medication at this time   Rolin Amel, WHNP-S 08/18/2024 10:57 AM

## 2024-08-18 NOTE — Progress Notes (Signed)
 Pt presents for NOB visit. No concerns

## 2024-08-19 ENCOUNTER — Ambulatory Visit: Payer: Self-pay | Admitting: Obstetrics and Gynecology

## 2024-08-19 DIAGNOSIS — O23599 Infection of other part of genital tract in pregnancy, unspecified trimester: Secondary | ICD-10-CM | POA: Insufficient documentation

## 2024-08-19 LAB — CYTOLOGY - PAP: Diagnosis: NEGATIVE

## 2024-08-19 MED ORDER — METRONIDAZOLE 500 MG PO TABS: 500.0000 mg | ORAL_TABLET | Freq: Two times a day (BID) | ORAL | 0 refills | Status: AC

## 2024-09-15 ENCOUNTER — Encounter: Admitting: Obstetrics and Gynecology

## 2024-09-15 DIAGNOSIS — F1290 Cannabis use, unspecified, uncomplicated: Secondary | ICD-10-CM | POA: Insufficient documentation

## 2024-09-24 ENCOUNTER — Ambulatory Visit (HOSPITAL_BASED_OUTPATIENT_CLINIC_OR_DEPARTMENT_OTHER): Admitting: Obstetrics

## 2024-09-24 ENCOUNTER — Ambulatory Visit: Attending: Obstetrics and Gynecology

## 2024-09-24 ENCOUNTER — Other Ambulatory Visit: Payer: Self-pay | Admitting: *Deleted

## 2024-09-24 VITALS — BP 91/60 | HR 78

## 2024-09-24 DIAGNOSIS — F1290 Cannabis use, unspecified, uncomplicated: Secondary | ICD-10-CM | POA: Insufficient documentation

## 2024-09-24 DIAGNOSIS — O2612 Low weight gain in pregnancy, second trimester: Secondary | ICD-10-CM | POA: Insufficient documentation

## 2024-09-24 DIAGNOSIS — O99320 Drug use complicating pregnancy, unspecified trimester: Secondary | ICD-10-CM | POA: Insufficient documentation

## 2024-09-24 DIAGNOSIS — Z363 Encounter for antenatal screening for malformations: Secondary | ICD-10-CM | POA: Diagnosis not present

## 2024-09-24 DIAGNOSIS — O358XX0 Maternal care for other (suspected) fetal abnormality and damage, not applicable or unspecified: Secondary | ICD-10-CM

## 2024-09-24 DIAGNOSIS — Z3689 Encounter for other specified antenatal screening: Secondary | ICD-10-CM | POA: Insufficient documentation

## 2024-09-24 DIAGNOSIS — O321XX0 Maternal care for breech presentation, not applicable or unspecified: Secondary | ICD-10-CM | POA: Diagnosis not present

## 2024-09-24 DIAGNOSIS — Z3480 Encounter for supervision of other normal pregnancy, unspecified trimester: Secondary | ICD-10-CM | POA: Insufficient documentation

## 2024-09-24 DIAGNOSIS — Z3A19 19 weeks gestation of pregnancy: Secondary | ICD-10-CM

## 2024-09-24 NOTE — Progress Notes (Signed)
 MFM Consult Note  Alexis Gill is currently at 19 weeks and 6 days.  She was seen for a detailed fetal anatomy scan due to a low maternal BMI (16.83).  She denies any significant past medical history and denies any problems in her current pregnancy.    She had a cell free DNA test earlier in her pregnancy which indicated a low risk for trisomy 79, 77, and 13. A female fetus is predicted.   Sonographic findings Single intrauterine pregnancy at 19w 6d. Fetal cardiac activity:  Observed and appears normal. Presentation: Breech. The anatomic structures that were well seen appear normal without evidence of soft markers. The anatomic survey is complete.  Fetal biometry shows the estimated fetal weight at the 76th percentile. Amniotic fluid: Within normal limits.  MVP: 7.5 cm. Placenta: Posterior. Adnexa: No abnormality visualized. Cervical length: 3.8 cm.  The patient was informed that anomalies may be missed due to technical limitations. If the fetus is in a suboptimal position or maternal habitus is increased, visualization of the fetus in the maternal uterus may be impaired.  The increased risk of IUGR due to a low maternal BMI and poor maternal weight gain was discussed.  She was encouraged to continue eating a regular diet.    A follow-up exam was scheduled in our office in 10 weeks (in the third trimester) to assess the fetal growth.    The patient stated that all of her questions were answered today.  A total of 30 minutes was spent counseling and coordinating the care for this patient.  Greater than 50% of the time was spent in direct face-to-face contact.

## 2024-10-05 ENCOUNTER — Telehealth: Payer: Self-pay

## 2024-10-05 NOTE — Telephone Encounter (Signed)
 Called patient to schedule IBH appointment and also rescheduled a missed OB appointment for her. 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

## 2024-10-07 ENCOUNTER — Encounter: Admitting: Physician Assistant

## 2024-10-11 ENCOUNTER — Ambulatory Visit (INDEPENDENT_AMBULATORY_CARE_PROVIDER_SITE_OTHER): Admitting: Obstetrics

## 2024-10-11 ENCOUNTER — Encounter: Payer: Self-pay | Admitting: Obstetrics

## 2024-10-11 VITALS — BP 101/62 | HR 81 | Wt 107.7 lb

## 2024-10-11 DIAGNOSIS — F1290 Cannabis use, unspecified, uncomplicated: Secondary | ICD-10-CM | POA: Diagnosis not present

## 2024-10-11 DIAGNOSIS — A5901 Trichomonal vulvovaginitis: Secondary | ICD-10-CM

## 2024-10-11 DIAGNOSIS — Z3A22 22 weeks gestation of pregnancy: Secondary | ICD-10-CM

## 2024-10-11 DIAGNOSIS — O99320 Drug use complicating pregnancy, unspecified trimester: Secondary | ICD-10-CM

## 2024-10-11 DIAGNOSIS — O23599 Infection of other part of genital tract in pregnancy, unspecified trimester: Secondary | ICD-10-CM

## 2024-10-11 DIAGNOSIS — Z3480 Encounter for supervision of other normal pregnancy, unspecified trimester: Secondary | ICD-10-CM

## 2024-10-11 DIAGNOSIS — K219 Gastro-esophageal reflux disease without esophagitis: Secondary | ICD-10-CM | POA: Diagnosis not present

## 2024-10-11 MED ORDER — CONCEPT OB 130-92.4-1 MG PO CAPS: 1.0000 | ORAL_CAPSULE | Freq: Every day | ORAL | 3 refills | Status: AC

## 2024-10-11 NOTE — Progress Notes (Signed)
 Subjective:  Alexis Gill is a 28 y.o. G2P0010 at [redacted]w[redacted]d being seen today for ongoing prenatal care.  She is currently monitored for the following issues for this low-risk pregnancy and has Supervision of other normal pregnancy, antepartum; Trichomonal vaginitis in pregnancy; and Marijuana use during pregnancy on their problem list.  Patient reports heartburn.  Contractions: Not present. Vag. Bleeding: None.  Movement: Present. Denies leaking of fluid.   The following portions of the patient's history were reviewed and updated as appropriate: allergies, current medications, past family history, past medical history, past social history, past surgical history and problem list. Problem list updated.  Objective:   Vitals:   10/11/24 1129  BP: 101/62  Pulse: 81  Weight: 107 lb 11.2 oz (48.9 kg)    Fetal Status: Fetal Heart Rate (bpm): 140   Movement: Present     General:  Alert, oriented and cooperative. Patient is in no acute distress.  Skin: Skin is warm and dry. No rash noted.   Cardiovascular: Normal heart rate noted  Respiratory: Normal respiratory effort, no problems with respiration noted  Abdomen: Soft, gravid, appropriate for gestational age. Pain/Pressure: Present     Pelvic:  Cervical exam deferred        Extremities: Normal range of motion.  Edema: None  Mental Status: Normal mood and affect. Normal behavior. Normal judgment and thought content.   Urinalysis:      Assessment and Plan:  Pregnancy: G2P0010 at [redacted]w[redacted]d  1. Supervision of other normal pregnancy, antepartum (Primary)  2. Trichomonal vaginitis during pregnancy, antepartum, treated - repeat culture at 36 weeks  3. Marijuana use during pregnancy - cessation encouraged  Preterm labor symptoms and general obstetric precautions including but not limited to vaginal bleeding, contractions, leaking of fluid and fetal movement were reviewed in detail with the patient. Please refer to After Visit Summary for other  counseling recommendations.   Return in about 4 weeks (around 11/08/2024) for ROB.   Rudy Carlin LABOR, MD 10/11/2024

## 2024-10-11 NOTE — Progress Notes (Signed)
 Pt presents for rob. Pt has no questions or concerns at this time.

## 2024-10-12 ENCOUNTER — Encounter (HOSPITAL_BASED_OUTPATIENT_CLINIC_OR_DEPARTMENT_OTHER): Payer: Self-pay | Admitting: Emergency Medicine

## 2024-10-12 ENCOUNTER — Emergency Department (HOSPITAL_BASED_OUTPATIENT_CLINIC_OR_DEPARTMENT_OTHER)
Admission: EM | Admit: 2024-10-12 | Discharge: 2024-10-12 | Disposition: A | Discharge status: Home / Self Care | Attending: Emergency Medicine | Admitting: Emergency Medicine

## 2024-10-12 ENCOUNTER — Encounter: Payer: Self-pay | Admitting: Licensed Clinical Social Worker

## 2024-10-12 ENCOUNTER — Other Ambulatory Visit: Payer: Self-pay

## 2024-10-12 DIAGNOSIS — S00511A Abrasion of lip, initial encounter: Secondary | ICD-10-CM | POA: Diagnosis not present

## 2024-10-12 DIAGNOSIS — Z3492 Encounter for supervision of normal pregnancy, unspecified, second trimester: Secondary | ICD-10-CM

## 2024-10-12 DIAGNOSIS — Z7982 Long term (current) use of aspirin: Secondary | ICD-10-CM | POA: Insufficient documentation

## 2024-10-12 DIAGNOSIS — Z3A22 22 weeks gestation of pregnancy: Secondary | ICD-10-CM | POA: Insufficient documentation

## 2024-10-12 DIAGNOSIS — R6884 Jaw pain: Secondary | ICD-10-CM | POA: Diagnosis not present

## 2024-10-12 DIAGNOSIS — O9A212 Injury, poisoning and certain other consequences of external causes complicating pregnancy, second trimester: Secondary | ICD-10-CM | POA: Insufficient documentation

## 2024-10-12 DIAGNOSIS — O26892 Other specified pregnancy related conditions, second trimester: Secondary | ICD-10-CM | POA: Diagnosis not present

## 2024-10-12 NOTE — ED Notes (Signed)
 OB rapid response called and states patient is cleared seeing healthy heart rate and movement.  States patient can be taken off fetal heart monitor.
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
## 2024-10-12 NOTE — ED Notes (Signed)
 OB care [facility] @  [address] 2nd preg, 1 miscarriage

## 2024-10-12 NOTE — ED Notes (Signed)
 Spoke with Vanessa at [facility] to dispatch [employer] per pt request 18:36 TC

## 2024-10-12 NOTE — ED Notes (Signed)
 Called Damien at [facility] to cancel [employer] request per pt's request. She is leaving and will follow up herself 18:57 TC
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
## 2024-11-08 ENCOUNTER — Encounter: Admitting: Family Medicine

## 2024-11-09 ENCOUNTER — Ambulatory Visit: Admitting: Obstetrics

## 2024-11-09 ENCOUNTER — Encounter: Payer: Self-pay | Admitting: Obstetrics

## 2024-11-09 VITALS — BP 114/70 | HR 85 | Wt 106.3 lb

## 2024-11-09 DIAGNOSIS — O99322 Drug use complicating pregnancy, second trimester: Secondary | ICD-10-CM | POA: Diagnosis not present

## 2024-11-09 DIAGNOSIS — F1290 Cannabis use, unspecified, uncomplicated: Secondary | ICD-10-CM | POA: Diagnosis not present

## 2024-11-09 DIAGNOSIS — Z3480 Encounter for supervision of other normal pregnancy, unspecified trimester: Secondary | ICD-10-CM

## 2024-11-09 DIAGNOSIS — O99320 Drug use complicating pregnancy, unspecified trimester: Secondary | ICD-10-CM

## 2024-11-09 DIAGNOSIS — A5901 Trichomonal vulvovaginitis: Secondary | ICD-10-CM | POA: Diagnosis not present

## 2024-11-09 DIAGNOSIS — Z3A26 26 weeks gestation of pregnancy: Secondary | ICD-10-CM | POA: Diagnosis not present

## 2024-11-09 DIAGNOSIS — O23592 Infection of other part of genital tract in pregnancy, second trimester: Secondary | ICD-10-CM | POA: Diagnosis not present

## 2024-11-09 NOTE — Progress Notes (Signed)
 Subjective:  Alexis Gill is a 28 y.o. G2P0010 at [redacted]w[redacted]d being seen today for ongoing prenatal care.  She is currently monitored for the following issues for this low-risk pregnancy and has Supervision of other normal pregnancy, antepartum; Trichomonal vaginitis in pregnancy; and Marijuana use during pregnancy on their problem list.  Patient reports no complaints.  Contractions: Not present. Vag. Bleeding: None.  Movement: Present. Denies leaking of fluid.   The following portions of the patient's history were reviewed and updated as appropriate: allergies, current medications, past family history, past medical history, past social history, past surgical history and problem list. Problem list updated.  Objective:   Vitals:   11/09/24 1046  BP: 114/70  Pulse: 85  Weight: 106 lb 4.8 oz (48.2 kg)    Fetal Status: Fetal Heart Rate (bpm): 153   Movement: Present     General:  Alert, oriented and cooperative. Patient is in no acute distress.  Skin: Skin is warm and dry. No rash noted.   Cardiovascular: Normal heart rate noted  Respiratory: Normal respiratory effort, no problems with respiration noted  Abdomen: Soft, gravid, appropriate for gestational age. Pain/Pressure: Present     Pelvic:  Cervical exam deferred        Extremities: Normal range of motion.  Edema: None  Mental Status: Normal mood and affect. Normal behavior. Normal judgment and thought content.   Urinalysis:      Assessment and Plan:  Pregnancy: G2P0010 at [redacted]w[redacted]d  1. Supervision of other normal pregnancy, antepartum (Primary)  2. Trichomonal vaginitis during pregnancy, antepartum, treated - needs TOC cultures at 28 weeks  3. Marijuana use during pregnancy - cessation encouraged   Preterm labor symptoms and general obstetric precautions including but not limited to vaginal bleeding, contractions, leaking of fluid and fetal movement were reviewed in detail with the patient. Please refer to After Visit Summary for  other counseling recommendations.   Return in about 2 weeks (around 11/23/2024) for ROB, 2 hour OGTT .   Rudy Carlin LABOR, MD 11/09/2024

## 2024-11-09 NOTE — Progress Notes (Signed)
 Pt presents for rob. Pt has no questions or concerns at this time.

## 2024-11-29 ENCOUNTER — Inpatient Hospital Stay (HOSPITAL_COMMUNITY)
Admission: AD | Admit: 2024-11-29 | Discharge: 2024-11-29 | Discharge status: Against Medical Advice | Attending: Obstetrics and Gynecology | Admitting: Obstetrics and Gynecology

## 2024-11-29 ENCOUNTER — Other Ambulatory Visit: Payer: Self-pay

## 2024-11-29 DIAGNOSIS — Z3A29 29 weeks gestation of pregnancy: Secondary | ICD-10-CM | POA: Insufficient documentation

## 2024-11-29 DIAGNOSIS — Z3483 Encounter for supervision of other normal pregnancy, third trimester: Secondary | ICD-10-CM | POA: Diagnosis present

## 2024-11-29 NOTE — MAU Note (Signed)
 Pt not to stay. Told she could come back at anytime for reeval. Signed AMA form . Notifed Dr. Jomarie

## 2024-11-29 NOTE — MAU Note (Addendum)
.  Alexis Gill is a 28 y.o. at [redacted]w[redacted]d here in MAU reporting: Vomiting blood one time today she saw it mixed in with her emesis Ate a spicy bowl last night due cravings  Wants to check on baby.  Decreased fetal movement since throwing up the blood one hour ago.  Feels abdominal cramping   Onset of complaint: today  Pain score: 6/10 Vitals:   11/29/24 1347  BP: 97/66  Pulse: 79  Resp: 20  Temp: 98.2 F (36.8 C)  SpO2: 100%     FHT: 142 Lab orders placed from triage:   ua

## 2024-12-03 ENCOUNTER — Other Ambulatory Visit (HOSPITAL_COMMUNITY)
Admission: RE | Admit: 2024-12-03 | Discharge: 2024-12-03 | Disposition: A | Discharge status: Home / Self Care | Source: Ambulatory Visit | Attending: Obstetrics and Gynecology | Admitting: Obstetrics and Gynecology

## 2024-12-03 ENCOUNTER — Other Ambulatory Visit

## 2024-12-03 ENCOUNTER — Encounter: Payer: Self-pay | Admitting: Obstetrics and Gynecology

## 2024-12-03 ENCOUNTER — Ambulatory Visit: Payer: Self-pay | Admitting: Obstetrics and Gynecology

## 2024-12-03 VITALS — BP 118/75 | HR 80 | Wt 113.0 lb

## 2024-12-03 DIAGNOSIS — Z3A29 29 weeks gestation of pregnancy: Secondary | ICD-10-CM

## 2024-12-03 DIAGNOSIS — A5901 Trichomonal vulvovaginitis: Secondary | ICD-10-CM | POA: Diagnosis not present

## 2024-12-03 DIAGNOSIS — Z3480 Encounter for supervision of other normal pregnancy, unspecified trimester: Secondary | ICD-10-CM | POA: Insufficient documentation

## 2024-12-03 DIAGNOSIS — O23599 Infection of other part of genital tract in pregnancy, unspecified trimester: Secondary | ICD-10-CM | POA: Diagnosis present

## 2024-12-03 DIAGNOSIS — O23593 Infection of other part of genital tract in pregnancy, third trimester: Secondary | ICD-10-CM

## 2024-12-03 NOTE — Progress Notes (Signed)
" ° °  PRENATAL VISIT NOTE  Subjective:  Alexis Gill is a 29 y.o. G2P0010 at [redacted]w[redacted]d being seen today for ongoing prenatal care.  She is currently monitored for the following issues for this low-risk pregnancy and has Supervision of other normal pregnancy, antepartum; Trichomonal vaginitis in pregnancy; and Marijuana use during pregnancy on their problem list.  Patient reports no complaints.  Contractions: Irritability. Vag. Bleeding: None.  Movement: Present. Denies leaking of fluid.   The following portions of the patient's history were reviewed and updated as appropriate: allergies, current medications, past family history, past medical history, past social history, past surgical history and problem list.   Objective:   Vitals:   12/03/24 1011  BP: 118/75  Pulse: 80  Weight: 113 lb (51.3 kg)    Fetal Status:  Fetal Heart Rate (bpm): 143 Fundal Height: 30 cm Movement: Present    General: Alert, oriented and cooperative. Patient is in no acute distress.  Skin: Skin is warm and dry. No rash noted.   Cardiovascular: Normal heart rate noted  Respiratory: Normal respiratory effort, no problems with respiration noted  Abdomen: Soft, gravid, appropriate for gestational age.  Pain/Pressure: Present     Pelvic: Cervical exam deferred        Extremities: Normal range of motion.  Edema: None  Mental Status: Normal mood and affect. Normal behavior. Normal judgment and thought content.      08/09/2024   11:02 AM  Depression screen PHQ 2/9  Decreased Interest 0  Down, Depressed, Hopeless 0  PHQ - 2 Score 0  Altered sleeping 0  Tired, decreased energy 0  Change in appetite 0  Feeling bad or failure about yourself  0  Trouble concentrating 0  Moving slowly or fidgety/restless 0  Suicidal thoughts 0  PHQ-9 Score 0      Data saved with a previous flowsheet row definition        08/09/2024   11:03 AM  GAD 7 : Generalized Anxiety Score  Nervous, Anxious, on Edge 1  Control/stop worrying  1  Worry too much - different things 1  Trouble relaxing 1  Restless 3  Easily annoyed or irritable 3  Afraid - awful might happen 1  Total GAD 7 Score 11    Assessment and Plan:  Pregnancy: G2P0010 at [redacted]w[redacted]d 1. Supervision of other normal pregnancy, antepartum (Primary) Patient is doing well without complaints Third trimester labs and glucola today Patient undecided on pediatrician (list provided) and opted out of WB - Glucose Tolerance, 2 Hours w/1 Hour - RPR - CBC - HIV antibody (with reflex)  2. [redacted] weeks gestation of pregnancy   3. Trichomonal vaginitis during pregnancy, antepartum TOC today  Preterm labor symptoms and general obstetric precautions including but not limited to vaginal bleeding, contractions, leaking of fluid and fetal movement were reviewed in detail with the patient. Please refer to After Visit Summary for other counseling recommendations.   Return in about 2 weeks (around 12/17/2024) for in person, ROB, Low risk.  Future Appointments  Date Time Provider Department Center  12/07/2024  1:15 PM [facility] PROVIDER 1 [facility]  12/07/2024  1:30 PM [facility] US2 [facility]  12/20/2024  2:50 PM Delores Nidia CROME, FNP [facility] None    Winton Felt, MD  "

## 2024-12-03 NOTE — Progress Notes (Signed)
 ROB/GTT. Declined TDAP and FLU vaccines. TOC self swab done today.

## 2024-12-03 NOTE — Patient Instructions (Signed)

## 2024-12-04 LAB — CBC
Hematocrit: 29 % — ABNORMAL LOW (ref 34.0–46.6)
Hemoglobin: 9.5 g/dL — ABNORMAL LOW (ref 11.1–15.9)
MCH: 33 pg (ref 26.6–33.0)
MCHC: 32.8 g/dL (ref 31.5–35.7)
MCV: 101 fL — ABNORMAL HIGH (ref 79–97)
Platelets: 130 x10E3/uL — ABNORMAL LOW (ref 150–450)
RBC: 2.88 x10E6/uL — ABNORMAL LOW (ref 3.77–5.28)
RDW: 12.7 % (ref 11.7–15.4)
WBC: 7.6 x10E3/uL (ref 3.4–10.8)

## 2024-12-04 LAB — GLUCOSE TOLERANCE, 2 HOURS W/ 1HR
Glucose, 1 hour: 107 mg/dL (ref 70–179)
Glucose, 2 hour: 84 mg/dL (ref 70–152)
Glucose, Fasting: 71 mg/dL (ref 70–91)

## 2024-12-04 LAB — SYPHILIS: RPR W/REFLEX TO RPR TITER AND TREPONEMAL ANTIBODIES, TRADITIONAL SCREENING AND DIAGNOSIS ALGORITHM: RPR Ser Ql: NONREACTIVE

## 2024-12-04 LAB — HIV ANTIBODY (ROUTINE TESTING W REFLEX): HIV Screen 4th Generation wRfx: NONREACTIVE

## 2024-12-06 DIAGNOSIS — O99013 Anemia complicating pregnancy, third trimester: Secondary | ICD-10-CM | POA: Insufficient documentation

## 2024-12-06 DIAGNOSIS — Z3480 Encounter for supervision of other normal pregnancy, unspecified trimester: Secondary | ICD-10-CM

## 2024-12-06 LAB — CERVICOVAGINAL ANCILLARY ONLY
Comment: NEGATIVE
Trichomonas: NEGATIVE

## 2024-12-06 MED ORDER — FERROUS SULFATE 325 (65 FE) MG PO TBEC: 325.0000 mg | DELAYED_RELEASE_TABLET | ORAL | 1 refills | Status: AC

## 2024-12-07 ENCOUNTER — Ambulatory Visit (HOSPITAL_BASED_OUTPATIENT_CLINIC_OR_DEPARTMENT_OTHER)

## 2024-12-07 ENCOUNTER — Ambulatory Visit: Attending: Obstetrics | Admitting: Obstetrics and Gynecology

## 2024-12-07 ENCOUNTER — Other Ambulatory Visit: Payer: Self-pay | Admitting: *Deleted

## 2024-12-07 DIAGNOSIS — Z364 Encounter for antenatal screening for fetal growth retardation: Secondary | ICD-10-CM | POA: Insufficient documentation

## 2024-12-07 DIAGNOSIS — O358XX0 Maternal care for other (suspected) fetal abnormality and damage, not applicable or unspecified: Secondary | ICD-10-CM

## 2024-12-07 DIAGNOSIS — O2613 Low weight gain in pregnancy, third trimester: Secondary | ICD-10-CM | POA: Diagnosis not present

## 2024-12-07 DIAGNOSIS — Z68.41 Body mass index (BMI) pediatric, less than 5th percentile for age: Secondary | ICD-10-CM

## 2024-12-07 DIAGNOSIS — O99323 Drug use complicating pregnancy, third trimester: Secondary | ICD-10-CM | POA: Diagnosis not present

## 2024-12-07 DIAGNOSIS — Z3A30 30 weeks gestation of pregnancy: Secondary | ICD-10-CM

## 2024-12-07 DIAGNOSIS — F1290 Cannabis use, unspecified, uncomplicated: Secondary | ICD-10-CM

## 2024-12-07 DIAGNOSIS — Z681 Body mass index (BMI) 19 or less, adult: Secondary | ICD-10-CM

## 2024-12-07 NOTE — Progress Notes (Signed)
 Maternal-Fetal Medicine Consultation  Name: Alexis Gill  MRN: [id number]  GA: [id number] [redacted]w[redacted]d   Patient is here for fetal growth assessment. Pregravid BMI 16.8.  Patient does not have gestational diabetes. Blood pressure today at our office is 107/55 mmHg.  Ultrasound Normal fetal growth and amniotic fluid.  Cephalic presentation.  Maternal Underweight It is defined as BMI less than 18.5 Kg/m2.  Studies have shown that low maternal weight can be associated with increased risks of fetal growth restriction, low birthweight, preterm delivery and slight increase in perinatal mortality. I reassured the patient of normal fetal growth.  Recommendations -Fetal growth assessment in 6 weeks.     Consultation including face-to-face (more than 50%) counseling 10 minutes.

## 2024-12-20 ENCOUNTER — Encounter: Payer: Self-pay | Admitting: Obstetrics and Gynecology

## 2024-12-30 ENCOUNTER — Encounter: Admitting: Obstetrics

## 2025-01-05 ENCOUNTER — Ambulatory Visit: Admitting: Obstetrics

## 2025-01-05 ENCOUNTER — Encounter: Payer: Self-pay | Admitting: Obstetrics

## 2025-01-05 VITALS — BP 115/73 | HR 72 | Wt 123.6 lb

## 2025-01-05 DIAGNOSIS — Z3480 Encounter for supervision of other normal pregnancy, unspecified trimester: Secondary | ICD-10-CM

## 2025-01-05 DIAGNOSIS — F1290 Cannabis use, unspecified, uncomplicated: Secondary | ICD-10-CM

## 2025-01-05 DIAGNOSIS — A5901 Trichomonal vulvovaginitis: Secondary | ICD-10-CM

## 2025-01-05 DIAGNOSIS — O99013 Anemia complicating pregnancy, third trimester: Secondary | ICD-10-CM

## 2025-01-05 NOTE — Progress Notes (Signed)
 Pt presents for rob. Pt has no questions or concerns at this time.

## 2025-01-05 NOTE — Progress Notes (Signed)
 Subjective:  Alexis Gill is a 29 y.o. G2P0010 at [redacted]w[redacted]d being seen today for ongoing prenatal care.  She is currently monitored for the following issues for this low-risk pregnancy and has Supervision of other normal pregnancy, antepartum; Trichomonal vaginitis in pregnancy; Marijuana use during pregnancy; and Anemia affecting pregnancy in third trimester on their problem list.  Patient reports heartburn.   .  .   . Denies leaking of fluid.   The following portions of the patient's history were reviewed and updated as appropriate: allergies, current medications, past family history, past medical history, past social history, past surgical history and problem list. Problem list updated.  Objective:  There were no vitals filed for this visit.  Fetal Status:           General:  Alert, oriented and cooperative. Patient is in no acute distress.  Skin: Skin is warm and dry. No rash noted.   Cardiovascular: Normal heart rate noted  Respiratory: Normal respiratory effort, no problems with respiration noted  Abdomen: Soft, gravid, appropriate for gestational age.       Pelvic:  Cervical exam deferred        Extremities: Normal range of motion.     Mental Status: Normal mood and affect. Normal behavior. Normal judgment and thought content.   Urinalysis:      Assessment and Plan:  Pregnancy: G2P0010 at [redacted]w[redacted]d  1. Supervision of other normal pregnancy, antepartum (Primary)  2. Anemia affecting pregnancy in third trimester - taking iron  3. Trichomonal vaginitis during pregnancy, antepartum, treated - repeat cultures at 36 weeks  4. Marijuana use during pregnancy - cessation encouraged    Preterm labor symptoms and general obstetric precautions including but not limited to vaginal bleeding, contractions, leaking of fluid and fetal movement were reviewed in detail with the patient. Please refer to After Visit Summary for other counseling recommendations.   Return in about 2 weeks (around  01/19/2025) for ROB.   Rudy Carlin LABOR, MD 01/05/2025

## 2025-01-18 ENCOUNTER — Ambulatory Visit

## 2025-01-19 ENCOUNTER — Encounter: Payer: Self-pay | Admitting: Obstetrics & Gynecology
